# Patient Record
Sex: Female | Born: 1995 | Race: White | Hispanic: No | Marital: Single | Smoking: Current every day smoker
Health system: Southern US, Community
[De-identification: ages and names within clinical notes are randomized; demographics above are authoritative.]

## PROBLEM LIST (undated history)

## (undated) DIAGNOSIS — Z9151 Personal history of suicidal behavior: Secondary | ICD-10-CM

## (undated) DIAGNOSIS — Z8619 Personal history of other infectious and parasitic diseases: Secondary | ICD-10-CM

## (undated) DIAGNOSIS — F419 Anxiety disorder, unspecified: Secondary | ICD-10-CM

## (undated) DIAGNOSIS — F329 Major depressive disorder, single episode, unspecified: Secondary | ICD-10-CM

## (undated) DIAGNOSIS — R45851 Suicidal ideations: Secondary | ICD-10-CM

## (undated) DIAGNOSIS — O039 Complete or unspecified spontaneous abortion without complication: Secondary | ICD-10-CM

## (undated) DIAGNOSIS — F909 Attention-deficit hyperactivity disorder, unspecified type: Secondary | ICD-10-CM

## (undated) DIAGNOSIS — IMO0001 Reserved for inherently not codable concepts without codable children: Secondary | ICD-10-CM

## (undated) DIAGNOSIS — Z915 Personal history of self-harm: Secondary | ICD-10-CM

## (undated) DIAGNOSIS — J45909 Unspecified asthma, uncomplicated: Secondary | ICD-10-CM

## (undated) DIAGNOSIS — F32A Depression, unspecified: Secondary | ICD-10-CM

## (undated) HISTORY — DX: Personal history of suicidal behavior: Z91.51

## (undated) HISTORY — DX: Suicidal ideations: R45.851

## (undated) HISTORY — DX: Depression, unspecified: F32.A

## (undated) HISTORY — DX: Personal history of self-harm: Z91.5

## (undated) HISTORY — PX: INDUCED ABORTION: SHX677

## (undated) HISTORY — DX: Unspecified asthma, uncomplicated: J45.909

## (undated) HISTORY — DX: Major depressive disorder, single episode, unspecified: F32.9

## (undated) HISTORY — DX: Personal history of other infectious and parasitic diseases: Z86.19

---

## 2015-03-26 DIAGNOSIS — F4325 Adjustment disorder with mixed disturbance of emotions and conduct: Secondary | ICD-10-CM | POA: Diagnosis not present

## 2015-03-26 DIAGNOSIS — Z3A00 Weeks of gestation of pregnancy not specified: Secondary | ICD-10-CM | POA: Insufficient documentation

## 2015-03-26 DIAGNOSIS — F17210 Nicotine dependence, cigarettes, uncomplicated: Secondary | ICD-10-CM | POA: Diagnosis not present

## 2015-03-26 DIAGNOSIS — O99330 Smoking (tobacco) complicating pregnancy, unspecified trimester: Secondary | ICD-10-CM | POA: Insufficient documentation

## 2015-03-26 DIAGNOSIS — F329 Major depressive disorder, single episode, unspecified: Secondary | ICD-10-CM | POA: Insufficient documentation

## 2015-03-26 DIAGNOSIS — O99340 Other mental disorders complicating pregnancy, unspecified trimester: Secondary | ICD-10-CM | POA: Diagnosis not present

## 2015-03-26 DIAGNOSIS — F1210 Cannabis abuse, uncomplicated: Secondary | ICD-10-CM | POA: Insufficient documentation

## 2015-03-26 NOTE — ED Notes (Signed)
Pt presents to ED with suicidal thoughts since Thursday. Lost custody of her daughter last week and found out she was pregnant on Thursday. Pt states her "baby daddy" left right after she found out she was pregnant and now she wants to die. Has been crying all day and talking about killing herself.  has hx of using oxycodone, percocet, marijuana, clonopin. Previous suicide attempt by jumping out of a car several years ago.

## 2015-03-27 ENCOUNTER — Emergency Department
Admission: EM | Admit: 2015-03-27 | Discharge: 2015-03-27 | Disposition: A | Discharge status: Home / Self Care | Payer: Medicaid Other | Attending: Emergency Medicine | Admitting: Emergency Medicine

## 2015-03-27 ENCOUNTER — Encounter: Payer: Self-pay | Admitting: Emergency Medicine

## 2015-03-27 DIAGNOSIS — F1210 Cannabis abuse, uncomplicated: Secondary | ICD-10-CM

## 2015-03-27 DIAGNOSIS — F329 Major depressive disorder, single episode, unspecified: Secondary | ICD-10-CM

## 2015-03-27 DIAGNOSIS — F4325 Adjustment disorder with mixed disturbance of emotions and conduct: Secondary | ICD-10-CM

## 2015-03-27 DIAGNOSIS — R45851 Suicidal ideations: Secondary | ICD-10-CM

## 2015-03-27 DIAGNOSIS — F32A Depression, unspecified: Secondary | ICD-10-CM

## 2015-03-27 DIAGNOSIS — Z3490 Encounter for supervision of normal pregnancy, unspecified, unspecified trimester: Secondary | ICD-10-CM

## 2015-03-27 HISTORY — DX: Attention-deficit hyperactivity disorder, unspecified type: F90.9

## 2015-03-27 HISTORY — DX: Anxiety disorder, unspecified: F41.9

## 2015-03-27 LAB — URINALYSIS COMPLETE WITH MICROSCOPIC (ARMC ONLY)
BACTERIA UA: NONE SEEN
BILIRUBIN URINE: NEGATIVE
GLUCOSE, UA: NEGATIVE mg/dL
HGB URINE DIPSTICK: NEGATIVE
Ketones, ur: NEGATIVE mg/dL
Nitrite: NEGATIVE
Protein, ur: NEGATIVE mg/dL
Specific Gravity, Urine: 1.016 (ref 1.005–1.030)
pH: 6 (ref 5.0–8.0)

## 2015-03-27 LAB — CBC WITH DIFFERENTIAL/PLATELET
BASOS PCT: 1 %
Basophils Absolute: 0 10*3/uL (ref 0–0.1)
Eosinophils Absolute: 0.1 10*3/uL (ref 0–0.7)
Eosinophils Relative: 2 %
HEMATOCRIT: 41.9 % (ref 35.0–47.0)
HEMOGLOBIN: 13.9 g/dL (ref 12.0–16.0)
LYMPHS ABS: 2.2 10*3/uL (ref 1.0–3.6)
Lymphocytes Relative: 24 %
MCH: 30.3 pg (ref 26.0–34.0)
MCHC: 33.3 g/dL (ref 32.0–36.0)
MCV: 90.9 fL (ref 80.0–100.0)
MONO ABS: 0.8 10*3/uL (ref 0.2–0.9)
MONOS PCT: 9 %
NEUTROS ABS: 6.1 10*3/uL (ref 1.4–6.5)
Neutrophils Relative %: 64 %
Platelets: 278 10*3/uL (ref 150–440)
RBC: 4.61 MIL/uL (ref 3.80–5.20)
RDW: 12.6 % (ref 11.5–14.5)
WBC: 9.3 10*3/uL (ref 3.6–11.0)

## 2015-03-27 LAB — COMPREHENSIVE METABOLIC PANEL
ALBUMIN: 4.8 g/dL (ref 3.5–5.0)
ALK PHOS: 51 U/L (ref 38–126)
ALT: 14 U/L (ref 14–54)
AST: 18 U/L (ref 15–41)
Anion gap: 7 (ref 5–15)
BILIRUBIN TOTAL: 0.9 mg/dL (ref 0.3–1.2)
BUN: 8 mg/dL (ref 6–20)
CALCIUM: 10 mg/dL (ref 8.9–10.3)
CO2: 26 mmol/L (ref 22–32)
Chloride: 104 mmol/L (ref 101–111)
Creatinine, Ser: 0.66 mg/dL (ref 0.44–1.00)
GFR calc Af Amer: >60 mL/min (ref >60)
GFR calc non Af Amer: >60 mL/min (ref >60)
GLUCOSE: 87 mg/dL (ref 65–99)
POTASSIUM: 3.9 mmol/L (ref 3.5–5.1)
SODIUM: 137 mmol/L (ref 135–145)
TOTAL PROTEIN: 8.8 g/dL — AB (ref 6.5–8.1)

## 2015-03-27 LAB — URINE DRUG SCREEN, QUALITATIVE (ARMC ONLY)
Amphetamines, Ur Screen: NOT DETECTED
BARBITURATES, UR SCREEN: NOT DETECTED
BENZODIAZEPINE, UR SCRN: NOT DETECTED
CANNABINOID 50 NG, UR ~~LOC~~: POSITIVE — AB
COCAINE METABOLITE, UR ~~LOC~~: NOT DETECTED
MDMA (Ecstasy)Ur Screen: NOT DETECTED
METHADONE SCREEN, URINE: NOT DETECTED
OPIATE, UR SCREEN: NOT DETECTED
PHENCYCLIDINE (PCP) UR S: NOT DETECTED
Tricyclic, Ur Screen: NOT DETECTED

## 2015-03-27 LAB — PREGNANCY, URINE: PREG TEST UR: POSITIVE — AB

## 2015-03-27 LAB — ETHANOL: Alcohol, Ethyl (B): <5 mg/dL (ref <5)

## 2015-03-27 NOTE — ED Notes (Signed)
BEHAVIORAL HEALTH ROUNDING Patient sleeping: No. Patient alert and oriented: yes Behavior appropriate: Yes.  ; If no, describe:  Nutrition and fluids offered: {No Toileting and hygiene offered: {No Sitter present: {YES Law enforcement present: {YES

## 2015-03-27 NOTE — ED Notes (Signed)
Patient sleeping currently. Visible by this RN as well as EDT and security. No distress noted.

## 2015-03-27 NOTE — ED Provider Notes (Signed)
-----------------------------------------   7:12 AM on 03/27/2015 -----------------------------------------   Blood pressure 103/70, pulse 85, temperature 98.6 F (37 C), temperature source Oral, resp. rate 20, height  (1.676 m), weight 111 lb (50.349 kg), last menstrual period 01/29/2015, SpO2 100 %.  The patient had no acute events since last update.  Calm and resting at this time.  Disposition is pending per Psychiatry/Behavioral Medicine team recommendations.     Sharyn Creamer, MD 03/27/15 [phone]

## 2015-03-27 NOTE — ED Notes (Signed)

## 2015-03-27 NOTE — ED Provider Notes (Signed)
[facility] Emergency Department Brandi Tucker Note  ____________________________________________  Time seen: Approximately 152 AM  I have reviewed the triage vital signs and the nursing notes.   HISTORY  Chief Complaint Suicidal    HPI Brandi Tucker is a 19 y.o. female who comes into the hospital because she is depressed. The patient reports that she is pregnant again and is unsure how far along she is. The patient reports that she has a 74-month-old daughter and does not want her daughter to be taken away from her. The patient reports that she had an abortion 2 months ago so her pregnancy is now considered high risk. The patient is having thoughts of killing herself. The patient denies having tried to kill herself before and does not have a plan. The patient reports she has taken Klonopin in the past but has not taken any because she is pregnant. The patient has some pain on the side but she reports it is getting better. According to the triage note the patient has tried to kill herself in the past by jumping out of a car and her daughter was taken away from her last week.   Past Medical History  Diagnosis Date  . ADHD (attention deficit hyperactivity disorder)   . Anxiety     There are no active problems to display for this patient.   Past Surgical History  Procedure Laterality Date  . Induced abortion      No current outpatient prescriptions on file.  Allergies Review of patient's allergies indicates no known allergies.  No family history on file.  Social History Social History  Substance Use Topics  . Smoking status: Current Every Day Smoker -- 0.50 packs/day    Types: Cigarettes  . Smokeless tobacco: Not on file  . Alcohol Use: Yes    Review of Systems Constitutional: No fever/chills Eyes: No visual changes. ENT: No sore throat. Cardiovascular: Denies chest pain. Respiratory: Denies shortness of breath. Gastrointestinal: No abdominal  pain.  No nausea, no vomiting.  No diarrhea.  No constipation. Genitourinary: Negative for dysuria. Musculoskeletal: Negative for back pain. Skin: Negative for rash. Neurological: Negative for headaches, focal weakness or numbness. Psych: Depressed  10-point ROS otherwise negative.  ____________________________________________   PHYSICAL EXAM:  VITAL SIGNS: ED Triage Vitals  Enc Vitals Group     BP 03/27/15 0001 115/73 mmHg     Pulse Rate 03/27/15 0001 99     Resp 03/27/15 0001 20     Temp 03/27/15 0001 98.6 F (37 C)     Temp Source 03/27/15 0001 Oral     SpO2 03/27/15 0001 100 %     Weight 03/27/15 0001 111 lb (50.349 kg)     Height 03/27/15 0001  (1.676 m)     Head Cir --      Peak Flow --      Pain Score 03/27/15 0002 0     Pain Loc --      Pain Edu? --      Excl. in GC? --     Constitutional: Alert and oriented. Well appearing and in no acute distress. Eyes: Conjunctivae are normal. PERRL. EOMI. Head: Atraumatic. Nose: No congestion/rhinnorhea. Mouth/Throat: Mucous membranes are moist.  Oropharynx non-erythematous. Cardiovascular: Normal rate, regular rhythm. Grossly normal heart sounds.  Good peripheral circulation. Respiratory: Normal respiratory effort.  No retractions. Lungs CTAB. Gastrointestinal: Soft and nontender. No distention. Positive bowel sounds. Musculoskeletal: No lower extremity tenderness nor edema.   Neurologic:  Normal speech and  language.  Skin:  Skin is warm, dry and intact.  Psychiatric: Depressed mood  ____________________________________________   LABS (all labs ordered are listed, but only abnormal results are displayed)  Labs Reviewed  COMPREHENSIVE METABOLIC PANEL - Abnormal; Notable for the following:    Total Protein 8.8 (*)    All other components within normal limits  URINE DRUG SCREEN, QUALITATIVE ([facility] ONLY) - Abnormal; Notable for the following:    Cannabinoid 50 Ng, Ur [place] POSITIVE (*)    All other components within  normal limits  URINALYSIS COMPLETEWITH MICROSCOPIC ([facility] ONLY) - Abnormal; Notable for the following:    Color, Urine YELLOW (*)    APPearance CLEAR (*)    Leukocytes, UA TRACE (*)    Squamous Epithelial / LPF 0-5 (*)    All other components within normal limits  PREGNANCY, URINE - Abnormal; Notable for the following:    Preg Test, Ur POSITIVE (*)    All other components within normal limits  ETHANOL  CBC WITH DIFFERENTIAL/PLATELET  POC URINE PREG, ED   ____________________________________________  EKG  none ____________________________________________  RADIOLOGY  None ____________________________________________   PROCEDURES  Procedure(s) performed: None  Critical Care performed: No  ____________________________________________   INITIAL IMPRESSION / ASSESSMENT AND PLAN / ED COURSE  Pertinent labs & imaging results that were available during my care of the patient were reviewed by me and considered in my medical decision making (see chart for details).  This is a 19 year old female who comes in with feeling thoughts of killing herself and depression. I will have the patient evaluated by the behavioral team for further evaluation. The patient will be evaluated by psych ____________________________________________   FINAL CLINICAL IMPRESSION(S) / ED DIAGNOSES  Final diagnoses:  Depression  Suicidal ideation      Rebecka Apley, MD 03/27/15 [phone]

## 2015-03-27 NOTE — ED Notes (Signed)
Meal given

## 2015-03-27 NOTE — ED Notes (Signed)
Patient spoke with RN regarding her continued depressed mood. She is still endorsing suicidal thoughts. Very worried about pregnancy. Patient is willing to be hospitalized for treatment if necessary. Maintained on 15 minute checks and observation by security camera for safety.

## 2015-03-27 NOTE — ED Notes (Signed)
Patient resting comfortably in room. No complaints or concerns voiced. No distress or abnormal behavior noted. Will continue to monitor with security cameras. Q 15 minute rounds continue. 
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
## 2015-03-27 NOTE — Discharge Instructions (Signed)
Depression Depression is feeling sad, low, down in the dumps, blue, gloomy, or empty. In general, there are two kinds of depression:  Normal sadness or grief. This can happen after something upsetting. It often goes away on its own within 2 weeks. After losing a loved one (bereavement), normal sadness and grief may last longer than two weeks. It usually gets better with time.  Clinical depression. This kind lasts longer than normal sadness or grief. It keeps you from doing the things you normally do in life. It is often hard to function at home, work, or at school. It may affect your relationships with others. Treatment is often needed. GET HELP RIGHT AWAY IF:  You have thoughts about hurting yourself or others.  You lose touch with reality (psychotic symptoms). You may:  See or hear things that are not real.  Have untrue beliefs about your life or people around you.  Your medicine is giving you problems. MAKE SURE YOU:  Understand these instructions.  Will watch your condition.  Will get help right away if you are not doing well or get worse. Document Released: 07/19/2010 Document Revised: 10/31/2013 Document Reviewed: 10/16/2011 Exit[employer] Patient Information 2015 [employer]. This information is not intended to replace advice given to you by your health care provider. Make sure you discuss any questions you have with your health care provider.  

## 2015-03-27 NOTE — BH Assessment (Signed)
Assessment Note  Brandi Tucker is an 19 y.o. female. [name] reports to the ED due to suicidal ideation.  She reports that she is feeling really depressed.  She stated repeatedly that she was "really depressed".  She reported to the TTS that she is pregnant and "I was in my feelings, I wanted to kill myself, but I'm fine now".  She reports that she has been having thoughts of suicide since last Thursday.  It was reported that she lost custody of her child last week, She had an abortion about 2 months ago, and she found out she was pregnant again 4 days ago.  She reports that her "baby daddy" broke up with her today after he found out she was pregnant.  She reports that she has used Oxycodone, Percocet, and marijuana in the past.  Diagnosis:   Past Medical History:  Past Medical History  Diagnosis Date  . ADHD (attention deficit hyperactivity disorder)   . Anxiety     Past Surgical History  Procedure Laterality Date  . Induced abortion      Family History: No family history on file.  Social History:  reports that she has been smoking Cigarettes.  She has been smoking about 0.50 packs per day. She does not have any smokeless tobacco history on file. She reports that she drinks alcohol. She reports that she uses illicit drugs (Marijuana).  Additional Social History:  Alcohol / Drug Use History of alcohol / drug use?: Yes  CIWA: CIWA-Ar BP: 115/73 mmHg Pulse Rate: 99 COWS:    Allergies: No Known Allergies  Home Medications:  (Not in a hospital admission)  OB/GYN Status:  Patient's last menstrual period was 01/29/2015.  General Assessment Data Location of Assessment: [facility] ED TTS Assessment: In system Is this a Tele or Face-to-Face Assessment?: Face-to-Face Is this an Initial Assessment or a Re-assessment for this encounter?: Initial Assessment Marital status: Single Is patient pregnant?: Yes Pregnancy Status: Unknown Can pt return to current living arrangement?:  Yes Admission Status: Involuntary Is patient capable of signing voluntary admission?: Yes Referral Source: Other Insurance type: Medicaid  Medical Screening Exam [facility] Walk-in ONLY) Medical Exam completed: Yes  Crisis Care Plan Name of Psychiatrist: None Name of Therapist: None  Education Status Is patient currently in school?: No Current Grade: n/a Highest grade of school patient has completed: 10th Name of school: n/a Contact person: n/a  Risk to self with the past 6 months Suicidal Ideation: No-Not Currently/Within Last 6 Months (prior to arrival at ED) Has patient been a risk to self within the past 6 months prior to admission? : No Suicidal Intent: No-Not Currently/Within Last 6 Months Has patient had any suicidal intent within the past 6 months prior to admission? : No Is patient at risk for suicide?: Yes Suicidal Plan?: No Has patient had any suicidal plan within the past 6 months prior to admission? : No Access to Means: No What has been your use of drugs/alcohol within the last 12 months?: Prior use of oxycodone, percocet, and marijuana Previous Attempts/Gestures: Yes How many times?: 1 Other Self Harm Risks: none Triggers for Past Attempts: Unknown Intentional Self Injurious Behavior: None Family Suicide History: No Recent stressful life event(s): Conflict (Comment) (Break up with boyfriend) Persecutory voices/beliefs?: No Depression: Yes Depression Symptoms: Tearfulness, Despondent Substance abuse history and/or treatment for substance abuse?: Yes Suicide prevention information given to non-admitted patients: Not applicable  Risk to Others within the past 6 months Homicidal Ideation: No Does patient have any  lifetime risk of violence toward others beyond the six months prior to admission? : No Thoughts of Harm to Others: No Current Homicidal Intent: No Current Homicidal Plan: No Access to Homicidal Means: No Identified Victim: None History of harm to  others?: No Assessment of Violence: None Noted Violent Behavior Description: None reported Does patient have access to weapons?: No Criminal Charges Pending?: No Does patient have a court date: Yes Court Date: 04/12/16 (Use of electric mail to harass/07-26-2015 Speeding) Is patient on probation?: No  Psychosis Hallucinations: None noted Delusions: None noted  Mental Status Report Appearance/Hygiene: In scrubs Eye Contact: Fair Motor Activity: Unremarkable Speech: Soft Level of Consciousness: Quiet/awake Mood: Empty Affect: Flat Anxiety Level: None Thought Processes: Coherent Judgement: Partial Orientation: Place, Time, Person Obsessive Compulsive Thoughts/Behaviors: Minimal  Cognitive Functioning Concentration: Normal Memory: Recent Intact IQ: Average Insight: Fair Impulse Control: Fair Appetite: Good Sleep: No Change Vegetative Symptoms: None  ADLScreening [facility] Assessment Services) Patient's cognitive ability adequate to safely complete daily activities?: No Patient able to express need for assistance with ADLs?: No Independently performs ADLs?: No  Prior Inpatient Therapy Prior Inpatient Therapy: No  Prior Outpatient Therapy Prior Outpatient Therapy: No Does patient have an ACCT team?: No Does patient have Intensive In-House Services?  : No Does patient have [facility] services? : No Does patient have P4CC services?: No  ADL Screening (condition at time of admission) Patient's cognitive ability adequate to safely complete daily activities?: No Patient able to express need for assistance with ADLs?: No Independently performs ADLs?: No       Abuse/Neglect Assessment (Assessment to be complete while patient is alone) Physical Abuse: Denies Verbal Abuse: Denies Sexual Abuse: Denies Exploitation of patient/patient's resources: Denies Self-Neglect: Denies Values / Beliefs Cultural Requests During Hospitalization: None Spiritual Requests During  Hospitalization: None   Advance Directives (For Healthcare) Does patient have an advance directive?: No    Additional Information 1:1 In Past 12 Months?: No CIRT Risk: No Elopement Risk: No Does patient have medical clearance?: Yes     Disposition:  Disposition Initial Assessment Completed for this Encounter: Yes Disposition of Patient: Referred to (Psychiatrist)  On Site Evaluation by:   Reviewed with Physician:    Justice Deeds 03/27/2015 3:28 AM

## 2015-03-27 NOTE — ED Notes (Signed)
BEHAVIORAL HEALTH ROUNDING Patient sleeping: Yes.   Patient alert and oriented: yes Behavior appropriate: Yes.  ; If no, describe:  Nutrition and fluids offered: No Toileting and hygiene offered: No Sitter present: yes Law enforcement present: Yes  
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
## 2015-03-27 NOTE — ED Notes (Signed)
Patient denies SI or HI. Discharge instructions reviewed with patient. She verbalizes understanding. Patient received copy of DC plan and all personal belongings.

## 2015-03-27 NOTE — ED Notes (Signed)
Pt. To BHU from ED ambulatory without difficulty, to room 8 . Report from [facility] RN. Pt. Is alert and oriented, warm and dry in no distress. Pt. Denies SI, HI, and AVH. Pt. Calm and cooperative. Pt. Made aware of security cameras and Q15 minute rounds. Pt. Encouraged to let Nursing staff know of any concerns or needs.

## 2015-03-27 NOTE — ED Notes (Signed)
ENVIRONMENTAL ASSESSMENT Potentially harmful objects out of patient reach: Yes.   Personal belongings secured: Yes.   Patient dressed in hospital provided attire only: Yes.   Plastic bags out of patient reach: Yes.   Patient care equipment (cords, cables, call bells, lines, and drains) shortened, removed, or accounted for: Yes.   Equipment and supplies removed from bottom of stretcher: Yes.   Potentially toxic materials out of patient reach: Yes.   Sharps container removed or out of patient reach: Yes.     Patient asleep on initial rounds. Maintained on 15 minute checks and observation by security camera for safety. 

## 2015-03-27 NOTE — ED Notes (Signed)
Patient to move to ED BHU per Dr. Zenda Alpers.

## 2015-03-27 NOTE — ED Notes (Signed)
Patient with visitor, supervised by patient advocate.  Maintained on 15 minute checks and observation by security camera for safety.

## 2015-03-29 ENCOUNTER — Emergency Department
Admission: EM | Admit: 2015-03-29 | Discharge: 2015-03-29 | Discharge status: Against Medical Advice | Payer: Medicaid Other | Attending: Emergency Medicine | Admitting: Emergency Medicine

## 2015-03-29 ENCOUNTER — Encounter: Payer: Self-pay | Admitting: Emergency Medicine

## 2015-03-29 ENCOUNTER — Emergency Department: Payer: Medicaid Other

## 2015-03-29 DIAGNOSIS — O99331 Smoking (tobacco) complicating pregnancy, first trimester: Secondary | ICD-10-CM | POA: Diagnosis not present

## 2015-03-29 DIAGNOSIS — R102 Pelvic and perineal pain: Secondary | ICD-10-CM | POA: Insufficient documentation

## 2015-03-29 DIAGNOSIS — O9989 Other specified diseases and conditions complicating pregnancy, childbirth and the puerperium: Secondary | ICD-10-CM | POA: Insufficient documentation

## 2015-03-29 DIAGNOSIS — Z3A01 Less than 8 weeks gestation of pregnancy: Secondary | ICD-10-CM | POA: Insufficient documentation

## 2015-03-29 DIAGNOSIS — F17210 Nicotine dependence, cigarettes, uncomplicated: Secondary | ICD-10-CM | POA: Insufficient documentation

## 2015-03-29 HISTORY — DX: Complete or unspecified spontaneous abortion without complication: O03.9

## 2015-03-29 HISTORY — DX: Reserved for inherently not codable concepts without codable children: IMO0001

## 2015-03-29 LAB — CBC
HCT: 39.4 % (ref 35.0–47.0)
Hemoglobin: 13.2 g/dL (ref 12.0–16.0)
MCH: 30.4 pg (ref 26.0–34.0)
MCHC: 33.7 g/dL (ref 32.0–36.0)
MCV: 90.4 fL (ref 80.0–100.0)
Platelets: 223 10*3/uL (ref 150–440)
RBC: 4.35 MIL/uL (ref 3.80–5.20)
RDW: 12.9 % (ref 11.5–14.5)
WBC: 10.8 10*3/uL (ref 3.6–11.0)

## 2015-03-29 LAB — ABO/RH: ABO/RH(D): O POS

## 2015-03-29 LAB — POCT PREGNANCY, URINE: Preg Test, Ur: POSITIVE — AB

## 2015-03-29 LAB — HCG, QUANTITATIVE, PREGNANCY: hCG, Beta Chain, Quant, S: 22648 m[IU]/mL — ABNORMAL HIGH (ref <5)

## 2015-03-29 NOTE — ED Provider Notes (Signed)
[facility] Emergency Department Provider Note  ____________________________________________  Time seen: 1:40 PM  I have reviewed the triage vital signs and the nursing notes.   HISTORY  Chief Complaint Pelvic Pain    HPI Brandi Tucker is a 19 y.o. female who complains of left lower pelvic pain which she reports it is moderate in intensity and sharp in nature. She thinks she is partially [redacted] weeks pregnant. She recently had a abortion 3-4 months ago. She denies fevers chills. No dysuria. No flank pain. No nausea no vomiting. No vaginal bleeding. No vaginal discharge. Her last period was the beginning of August     Past Medical History  Diagnosis Date  . ADHD (attention deficit hyperactivity disorder)   . Anxiety   . Abortion     Patient Active Problem List   Diagnosis Date Noted  . 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 Pregnancy 03/27/2015  . Marijuana abuse 03/27/2015    Past Surgical History  Procedure Laterality Date  . Induced abortion      No current outpatient prescriptions on file.  Allergies Review of patient's allergies indicates no known allergies.  No family history on file.  Social History Social History  Substance Use Topics  . Smoking status: Current Every Day Smoker -- 0.50 packs/day    Types: Cigarettes  . Smokeless tobacco: None  . Alcohol Use: Yes    Review of Systems  Constitutional: Negative for fever. Eyes: Negative for visual changes. ENT: Negative for sore throat Cardiovascular: Negative for chest pain. Respiratory: Negative for shortness of breath. Gastrointestinal: Negative for abdominal pain, vomiting and diarrhea. Genitourinary: Negative for dysuria. Musculoskeletal: Negative for back pain. Skin: Negative for rash. Neurologic: No headaches    ____________________________________________   PHYSICAL EXAM:  VITAL SIGNS: ED Triage Vitals  Enc Vitals  Group     BP 03/29/15 1203 118/73 mmHg     Pulse Rate 03/29/15 1203 92     Resp 03/29/15 1203 20     Temp 03/29/15 1203 98.3 F (36.8 C)     Temp Source 03/29/15 1203 Oral     SpO2 03/29/15 1203 100 %     Weight 03/29/15 1203 111 lb (50.349 kg)     Height 03/29/15 1203  (1.676 m)     Head Cir --      Peak Flow --      Pain Score 03/29/15 1204 10     Pain Loc --      Pain Edu? --      Excl. in GC? --      Constitutional: Alert and oriented. Well appearing and in no distress. Eyes: Conjunctivae are normal.  ENT   Head: Normocephalic and atraumatic.   Mouth/Throat: Mucous membranes are moist. Cardiovascular: Normal rate, regular rhythm. Normal and symmetric distal pulses are present in all extremities. No murmurs, rubs, or gallops. Respiratory: Normal respiratory effort without tachypnea nor retractions. Breath sounds are clear and equal bilaterally.  Gastrointestinal: With mild tenderness to palpation in the left lower quadrant. No distention. There is no CVA tenderness. Genitourinary: deferred Musculoskeletal: Nontender with normal range of motion in all extremities. No lower extremity tenderness nor edema. Neurologic:  Normal speech and language. No gross focal neurologic deficits are appreciated. Skin:  Skin is warm, dry and intact. No rash noted. Psychiatric: Mood and affect are normal. Patient exhibits appropriate insight and judgment.  ____________________________________________    LABS (pertinent positives/negatives)  Labs Reviewed  HCG, QUANTITATIVE,  PREGNANCY - Abnormal; Notable for the following:    hCG, Beta Chain, [place] [id number] (*)    All other components within normal limits  CBC  ABO/RH    ____________________________________________   EKG  None  ____________________________________________    RADIOLOGY I have personally reviewed any xrays that were ordered on this patient: Ultrasound pelvis  pending  ____________________________________________   PROCEDURES  Procedure(s) performed: none  Critical Care performed: none  ____________________________________________   INITIAL IMPRESSION / ASSESSMENT AND PLAN / ED COURSE  Pertinent labs & imaging results that were available during my care of the patient were reviewed by me and considered in my medical decision making (see chart for details).  Patient overall well-appearing. Her left lower quadrant/pelvic pain and positive pregnancy indicates ultrasound to rule out ectopic pregnancy.  ----------------------------------------- 3:26 PM on 03/29/2015 -----------------------------------------  Patient signed out to Dr. Derrill Kay to follow-up ultrasound  ____________________________________________   FINAL CLINICAL IMPRESSION(S) / ED DIAGNOSES  Final diagnoses:  None     Jene Every, MD 03/29/15 1526

## 2015-03-29 NOTE — ED Notes (Signed)
Pt presents to ed with left side pelvic pain and pressure started last night. Pt is [redacted] weeks pregnant and was sent over for further eval. Pt did have an abortion about two-three mths ago.  No bleeding today.

## 2015-03-29 NOTE — ED Notes (Signed)
C/o sore throat.  Throat pink.  Mucous membranes moist.  NAD.

## 2015-03-29 NOTE — ED Notes (Signed)
Waiting for [place] results.  Results not yet available.  Patient states that she must leave ED now because this is her "only ride and they have to be at work soon".  Discussed risks of signing out AMA, patient verbalizes understanding of risks.  Dr. Derrill Kay alerted.  Patient signed out AMA.  Will call ED later for [place] results.

## 2015-03-29 NOTE — ED Notes (Signed)
AAOx3.  Skin warm and dry.  Moving all extremities equally and strong.  Gait steady.  Posture relaxed.  Patient depart ED.

## 2015-03-29 NOTE — ED Notes (Signed)
POC urine preg:  Positive

## 2015-04-02 ENCOUNTER — Ambulatory Visit (INDEPENDENT_AMBULATORY_CARE_PROVIDER_SITE_OTHER): Payer: Medicaid Other | Admitting: Obstetrics and Gynecology

## 2015-04-02 ENCOUNTER — Other Ambulatory Visit: Payer: Self-pay | Admitting: Obstetrics and Gynecology

## 2015-04-02 VITALS — BP 112/74 | HR 90 | Wt 113.2 lb

## 2015-04-02 DIAGNOSIS — Z1389 Encounter for screening for other disorder: Secondary | ICD-10-CM

## 2015-04-02 DIAGNOSIS — Z331 Pregnant state, incidental: Secondary | ICD-10-CM

## 2015-04-02 DIAGNOSIS — Z36 Encounter for antenatal screening of mother: Secondary | ICD-10-CM

## 2015-04-02 DIAGNOSIS — Z113 Encounter for screening for infections with a predominantly sexual mode of transmission: Secondary | ICD-10-CM

## 2015-04-02 DIAGNOSIS — Z3687 Encounter for antenatal screening for uncertain dates: Secondary | ICD-10-CM
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
## 2015-04-02 NOTE — Patient Instructions (Signed)

## 2015-04-03 ENCOUNTER — Other Ambulatory Visit: Payer: Self-pay

## 2015-04-03 ENCOUNTER — Other Ambulatory Visit: Payer: Self-pay | Admitting: Obstetrics and Gynecology

## 2015-04-03 DIAGNOSIS — Z331 Pregnant state, incidental: Secondary | ICD-10-CM

## 2015-04-03 DIAGNOSIS — Z3687 Encounter for antenatal screening for uncertain dates: Secondary | ICD-10-CM

## 2015-04-03 DIAGNOSIS — Z1389 Encounter for screening for other disorder: Secondary | ICD-10-CM

## 2015-04-03 DIAGNOSIS — Z113 Encounter for screening for infections with a predominantly sexual mode of transmission: Secondary | ICD-10-CM

## 2015-04-03 DIAGNOSIS — O9989 Other specified diseases and conditions complicating pregnancy, childbirth and the puerperium: Principal | ICD-10-CM

## 2015-04-03 DIAGNOSIS — Z2839 Other underimmunization status: Secondary | ICD-10-CM

## 2015-04-03 DIAGNOSIS — Z283 Underimmunization status: Secondary | ICD-10-CM

## 2015-04-04 LAB — GC/CHLAMYDIA PROBE AMP
Chlamydia trachomatis, NAA: NEGATIVE
Neisseria gonorrhoeae by PCR: NEGATIVE

## 2015-04-05 ENCOUNTER — Ambulatory Visit: Payer: Medicaid Other

## 2015-04-05 ENCOUNTER — Other Ambulatory Visit: Payer: Medicaid Other

## 2015-04-05 DIAGNOSIS — Z36 Encounter for antenatal screening of mother: Secondary | ICD-10-CM | POA: Diagnosis not present

## 2015-04-05 DIAGNOSIS — Z331 Pregnant state, incidental: Secondary | ICD-10-CM

## 2015-04-05 DIAGNOSIS — Z3687 Encounter for antenatal screening for uncertain dates: Secondary | ICD-10-CM

## 2015-04-06 ENCOUNTER — Telehealth: Payer: Self-pay

## 2015-04-06 DIAGNOSIS — Z283 Underimmunization status: Secondary | ICD-10-CM | POA: Insufficient documentation

## 2015-04-06 DIAGNOSIS — O9989 Other specified diseases and conditions complicating pregnancy, childbirth and the puerperium: Principal | ICD-10-CM

## 2015-04-06 DIAGNOSIS — O09899 Supervision of other high risk pregnancies, unspecified trimester: Secondary | ICD-10-CM | POA: Insufficient documentation

## 2015-04-06 LAB — CBC WITH DIFFERENTIAL/PLATELET
BASOS: 0 %
Basophils Absolute: 0 10*3/uL (ref 0.0–0.2)
EOS (ABSOLUTE): 0.8 10*3/uL — ABNORMAL HIGH (ref 0.0–0.4)
EOS: 7 %
HEMATOCRIT: 39.7 % (ref 34.0–46.6)
HEMOGLOBIN: 13.3 g/dL (ref 11.1–15.9)
IMMATURE GRANS (ABS): 0 10*3/uL (ref 0.0–0.1)
IMMATURE GRANULOCYTES: 0 %
LYMPHS: 24 %
Lymphocytes Absolute: 2.8 10*3/uL (ref 0.7–3.1)
MCH: 29.8 pg (ref 26.6–33.0)
MCHC: 33.5 g/dL (ref 31.5–35.7)
MCV: 89 fL (ref 79–97)
MONOCYTES: 8 %
MONOS ABS: 0.9 10*3/uL (ref 0.1–0.9)
NEUTROS PCT: 61 %
Neutrophils Absolute: 6.9 10*3/uL (ref 1.4–7.0)
Platelets: 326 10*3/uL (ref 150–379)
RBC: 4.46 x10E6/uL (ref 3.77–5.28)
RDW: 12.5 % (ref 12.3–15.4)
WBC: 11.5 10*3/uL — AB (ref 3.4–10.8)

## 2015-04-06 LAB — URINE CULTURE

## 2015-04-06 LAB — VARICELLA ZOSTER ANTIBODY, IGM: Varicella IgM: 1.23 index — ABNORMAL HIGH (ref 0.00–0.90)

## 2015-04-06 LAB — RUBELLA ANTIBODY, IGM

## 2015-04-06 LAB — RPR: RPR: NONREACTIVE

## 2015-04-06 LAB — HIV ANTIBODY (ROUTINE TESTING W REFLEX): HIV SCREEN 4TH GENERATION: NONREACTIVE

## 2015-04-06 LAB — BETA HCG QUANT (REF LAB): HCG QUANT: 50854 m[IU]/mL

## 2015-04-06 LAB — HEPATITIS B SURFACE ANTIGEN: Hepatitis B Surface Ag: NEGATIVE

## 2015-04-06 MED ORDER — NITROFURANTOIN MONOHYD MACRO 100 MG PO CAPS: 100.0000 mg | ORAL_CAPSULE | Freq: Two times a day (BID) | ORAL | Status: DC

## 2015-04-06 NOTE — Telephone Encounter (Signed)
Pt aware. Med erx. 

## 2015-04-06 NOTE — Telephone Encounter (Signed)
-----   Message from Hildred Laser, MD sent at 04/06/2015  2:16 PM EDT ----- Regarding: UTI meds Please see note below.   ----- Message -----    From: Hildred Laser, MD    Sent: 04/06/2015   1:54 PM      To: Fenton Malling, LPN  Please inform patient of UTI.  Needs treatment with Macrobid 100 mg BID x 7 days.

## 2015-04-12 ENCOUNTER — Emergency Department
Admission: EM | Admit: 2015-04-12 | Discharge: 2015-04-12 | Discharge status: Against Medical Advice | Payer: Medicaid Other | Attending: Emergency Medicine | Admitting: Emergency Medicine

## 2015-04-12 ENCOUNTER — Encounter: Payer: Self-pay | Admitting: Emergency Medicine

## 2015-04-12 DIAGNOSIS — F17210 Nicotine dependence, cigarettes, uncomplicated: Secondary | ICD-10-CM | POA: Insufficient documentation

## 2015-04-12 DIAGNOSIS — O99331 Smoking (tobacco) complicating pregnancy, first trimester: Secondary | ICD-10-CM | POA: Diagnosis not present

## 2015-04-12 DIAGNOSIS — R1030 Lower abdominal pain, unspecified: Secondary | ICD-10-CM | POA: Diagnosis not present

## 2015-04-12 DIAGNOSIS — O9989 Other specified diseases and conditions complicating pregnancy, childbirth and the puerperium: Secondary | ICD-10-CM | POA: Diagnosis present

## 2015-04-12 DIAGNOSIS — Z3A01 Less than 8 weeks gestation of pregnancy: Secondary | ICD-10-CM | POA: Insufficient documentation

## 2015-04-12 LAB — URINALYSIS COMPLETE WITH MICROSCOPIC (ARMC ONLY)
BACTERIA UA: NONE SEEN
Bilirubin Urine: NEGATIVE
GLUCOSE, UA: NEGATIVE mg/dL
Hgb urine dipstick: NEGATIVE
Ketones, ur: NEGATIVE mg/dL
NITRITE: NEGATIVE
Protein, ur: NEGATIVE mg/dL
SPECIFIC GRAVITY, URINE: 1.018 (ref 1.005–1.030)
pH: 6 (ref 5.0–8.0)

## 2015-04-12 LAB — HCG, QUANTITATIVE, PREGNANCY: HCG, BETA CHAIN, QUANT, S: 113448 m[IU]/mL — AB (ref <5)

## 2015-04-12 LAB — CBC
HEMATOCRIT: 34.8 % — AB (ref 35.0–47.0)
Hemoglobin: 11.5 g/dL — ABNORMAL LOW (ref 12.0–16.0)
MCH: 29.9 pg (ref 26.0–34.0)
MCHC: 33.1 g/dL (ref 32.0–36.0)
MCV: 90.3 fL (ref 80.0–100.0)
Platelets: 250 10*3/uL (ref 150–440)
RBC: 3.85 MIL/uL (ref 3.80–5.20)
RDW: 13 % (ref 11.5–14.5)
WBC: 12.2 10*3/uL — ABNORMAL HIGH (ref 3.6–11.0)

## 2015-04-12 LAB — POCT PREGNANCY, URINE: PREG TEST UR: POSITIVE — AB

## 2015-04-12 NOTE — ED Notes (Addendum)
Pt to triage via w/c with no distress noted; pt reports approx [redacted]wks pregnant, having mid lower abd cramping and vag spotting; G3 P1; rx macrobid 10/7 by [facility] for UTI but did not get medication filled

## 2015-04-12 NOTE — ED Notes (Signed)
Spoke with Dr Dolores FrameSung regarding pt's presenting c/o; pt had u/s 10/6; will await HCG results before ordering repeat u/s at this time

## 2015-05-01 ENCOUNTER — Encounter: Payer: Self-pay | Admitting: Obstetrics and Gynecology

## 2015-05-01 ENCOUNTER — Encounter: Payer: Medicaid Other | Admitting: Obstetrics and Gynecology

## 2015-05-01 ENCOUNTER — Other Ambulatory Visit: Payer: Self-pay | Admitting: Obstetrics and Gynecology

## 2015-05-01 ENCOUNTER — Ambulatory Visit: Payer: Medicaid Other

## 2015-05-01 ENCOUNTER — Telehealth: Payer: Self-pay | Admitting: Obstetrics and Gynecology

## 2015-05-01 ENCOUNTER — Ambulatory Visit (INDEPENDENT_AMBULATORY_CARE_PROVIDER_SITE_OTHER): Payer: Medicaid Other | Admitting: Obstetrics and Gynecology

## 2015-05-01 VITALS — BP 110/69 | HR 86 | Ht 66.0 in | Wt 113.9 lb

## 2015-05-01 DIAGNOSIS — Z30018 Encounter for initial prescription of other contraceptives: Secondary | ICD-10-CM

## 2015-05-01 DIAGNOSIS — Z331 Pregnant state, incidental: Secondary | ICD-10-CM

## 2015-05-01 DIAGNOSIS — Z3687 Encounter for antenatal screening for uncertain dates: Secondary | ICD-10-CM

## 2015-05-01 DIAGNOSIS — Z7251 High risk heterosexual behavior: Secondary | ICD-10-CM | POA: Diagnosis not present

## 2015-05-01 DIAGNOSIS — O039 Complete or unspecified spontaneous abortion without complication: Secondary | ICD-10-CM | POA: Diagnosis not present

## 2015-05-01 DIAGNOSIS — Z716 Tobacco abuse counseling: Secondary | ICD-10-CM

## 2015-05-01 DIAGNOSIS — Z36 Encounter for antenatal screening of mother: Secondary | ICD-10-CM | POA: Diagnosis not present

## 2015-05-01 MED ORDER — NORELGESTROMIN-ETH ESTRADIOL 150-35 MCG/24HR TD PTWK: 1.0000 | MEDICATED_PATCH | TRANSDERMAL | Status: DC
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
## 2015-05-02 ENCOUNTER — Encounter: Payer: Self-pay | Admitting: Obstetrics and Gynecology

## 2015-05-02 NOTE — Progress Notes (Addendum)
GYNECOLOGY PROGRESS NOTE  Subjective:    Patient ID: Brandi Tucker, female    DOB: 07/12/1995, 19 y.o.   MRN: [id number]  HPI  Patient is a 19 y.o. [id number] female who presents for f/u of miscarriage. Patient's last menstrual period was 01/29/2015. Patient reports that she was seen ~ 3 weeks ago at an abortion clinic with plans for termination.  Was supposed to be ~ 13 weeks, however fetus was only noted to be 8 weeks with no FHT.  States that she was given a medication (Cytotec) for missed Ab.  Reports that several days later she noted to have heavy vaginal bleeding and severe cramping.  Was seen several times in the ER [facility]  Notes that most recently she was seen several days ago and was told that there was still some tissue present and that she may need a D&C. Was told to f/u with her GYN.    Patient reports that yesterday she passed something that looks like tissue.  Since then, her bleeding has decreased to minimal, and her cramping has improved.   The following portions of the patient's history were reviewed and updated as appropriate: allergies, current medications, past family history, past medical history, past social history, past surgical history and problem list.  Review of Systems Pertinent items noted in HPI and remainder of comprehensive ROS otherwise negative.   Objective:   Blood pressure 110/69, pulse 86, height 5\' 6"  (1.676 m), weight 113 lb 14.4 oz (51.665 kg), last menstrual period 01/29/2015, unknown if currently breastfeeding. General appearance: alert and no distress Abdomen: soft, non-tender; bowel sounds normal; no masses,  no organomegaly Pelvic: cervix normal in appearance, external genitalia normal, no adnexal masses or tenderness, no cervical motion tenderness, rectovaginal septum normal, uterus normal size, shape, and consistency and vagina normal without discharge and scant blood in vaginal vault.  Cervix closed.  Bladder nontender, urethra without  masses.  Extremities: extremities normal, atraumatic, no cyanosis or edema Neurologic: Alert and oriented X 3, normal strength and tone. Normal symmetric reflexes. Normal coordination and gait   Labs:  O POS/NEG  Imaging:   05/01/2015 Pelvic Ultrasound:  Findings:  No IUP seen. Clinically established (LMP) EDD of 11/05/15.  FHR: NA CRL measurement: None seen Yolk sac and and early anatomy is not seen  Right Ovary measures 3.2 x 1.6 x 2.1 cm. It is normal in appearance. Left Ovary measures 3.9 x 2.5 x 2.3 cm. It is normal appearance. Survey of the adnexa demonstrates no adnexal masses. There is a small amount of peritoneal fluid in the cul de sac.  Impression: 1. No IUP Seen.  Assessment:   Complete Ab Desires contraception  Plan:   1. Complete Abortion - imaging noting completion of miscarriage, no retained products.  Will follow BHCG trend.Marland Kitchen. BHCG ordered today.  To f/u in 2 weeks for repeat labs.  2. Discussion had with patient regarding miscarriage, and desire for contraception. Reviewed all reversible forms of birth control options available including abstinence; over the counter/barrier methods; hormonal contraceptive medication including pill, patch, ring, injection,contraceptive implant; hormonal and nonhormonal IUDs. Risks and benefits reviewed.  Questions were answered.  Patient desires the patch.  Information was given to patient to review. Discussed slightly increased risk of blood clots/DVTs on patch.  Signed consent form.  Will prescribe.  To wait 2 weeks before initiation of patch, to use backup method until after 1st week of use of the contraception.  Counseled on smoking cessation.  3. Discussion had with patient  regarding high risk sexual behavior (was unable to determine FOB due to 3 potential partners).  Counseled on use of barrier method, limiting number of partners.    A total of 15 minutes were spent face-to-face with the patient during this encounter and over  half of that time dealt with counseling and coordination of care.   Hildred Laser, MD [facility]

## 2015-05-02 NOTE — Telephone Encounter (Signed)
Pt was seen in clinic on 05/01/2015 by Dr.cherry and evaluated.

## 2015-05-03 LAB — BETA HCG QUANT (REF LAB): hCG Quant: 520 m[IU]/mL

## 2015-05-03 LAB — SPECIMEN STATUS REPORT

## 2015-05-07 ENCOUNTER — Telehealth: Payer: Self-pay | Admitting: Obstetrics and Gynecology

## 2015-05-07 NOTE — Telephone Encounter (Signed)
PT CALLED FOR RESULTS OF BETA HGB. SHE ALSO SAID SHE HAS TAKEN 3 PREGNANCY TEST AND ALL ARE POSITIVE

## 2015-05-10 NOTE — Telephone Encounter (Signed)
Pt notified of bhcg results: 520 on 05/01/15; as a month ago bhcg result: [id number].  Pt informed that she had miscarried and need another bhcg done in 2 wks. From 05/01/15 and it is very important to f/u with this until results are negative. Pt states she may be pregnant again and that she is seeing her PCP tomorrow and will have him follow her for this. She probably will not be back here for f/u. Dr. Valentino Saxonherry notified. Did not express any concerns about her visits here.

## 2015-05-11 ENCOUNTER — Telehealth: Payer: Self-pay

## 2015-05-11 NOTE — Telephone Encounter (Signed)
[facility] [place]. Pt there stating she is pregnant and test was positive. Pt did not inform them of miscarriage and Bhcg's, currently on 05/01/15 - 520. Dr. Valentino Saxonherry suggested Bhcg if dropping pt is not pregnant and if rises she could be. Office was able to pull records on care everywhere.

## 2016-10-01 IMAGING — US US OB COMP LESS 14 WK
3 of 4 series · 13 of 28 positions shown · non-contrast
Comparison: None.

CLINICAL DATA: Left pelvic pain. Unsure of LMP. Recent spontaneous
abortion approximately 3 months ago.

EXAM:
OBSTETRIC <14 WK US AND TRANSVAGINAL OB US
TECHNIQUE: Both transabdominal and transvaginal ultrasound examinations were
performed for complete evaluation of the gestation as well as the
maternal uterus, adnexal regions, and pelvic cul-de-sac.
Transvaginal technique was performed to assess early pregnancy.

[Series 1: us ob comp less 14 wk · 0.21mm/px · 7 of 112 slices shown (1 of 3)]
[im 8/112]
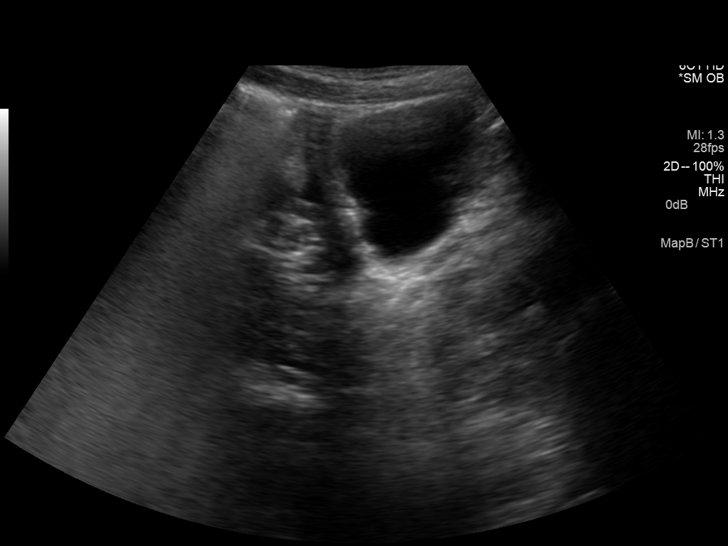
[im 23/112]
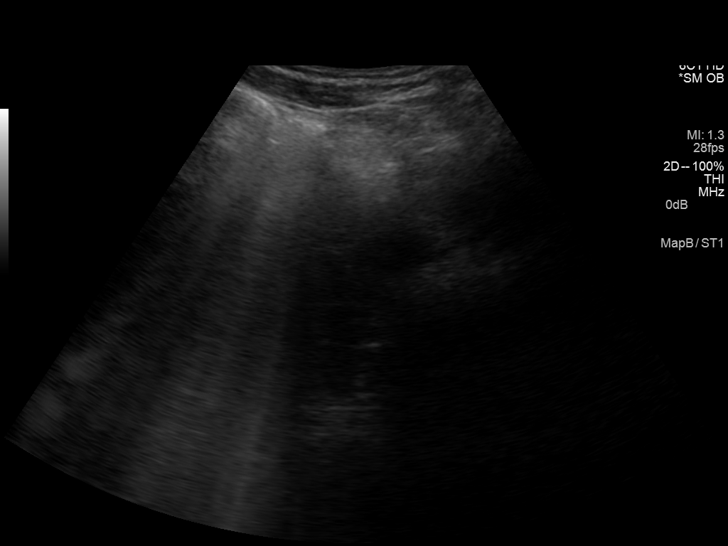
[im 38/112]
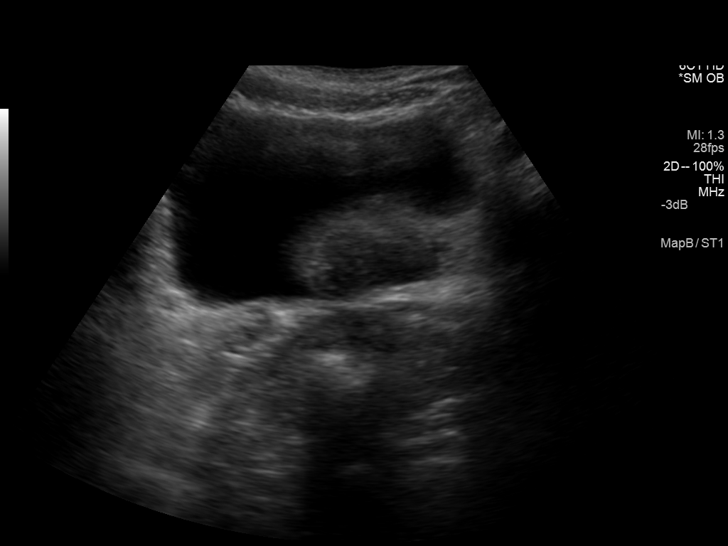
[im 52/112]
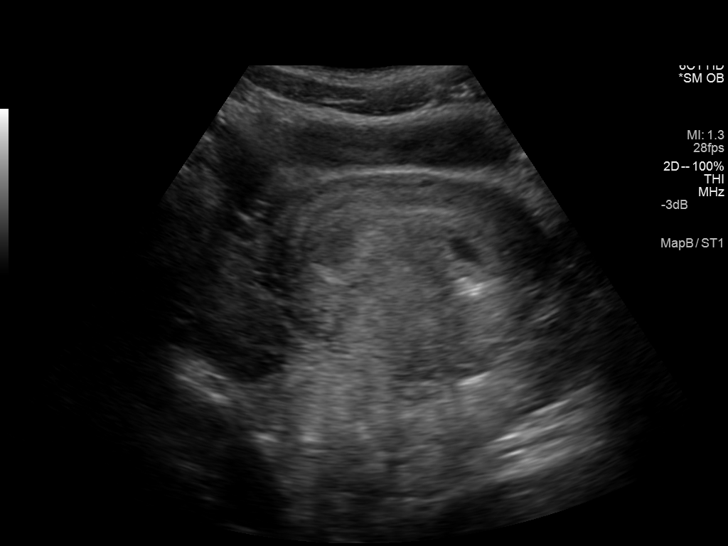
[im 67/112]
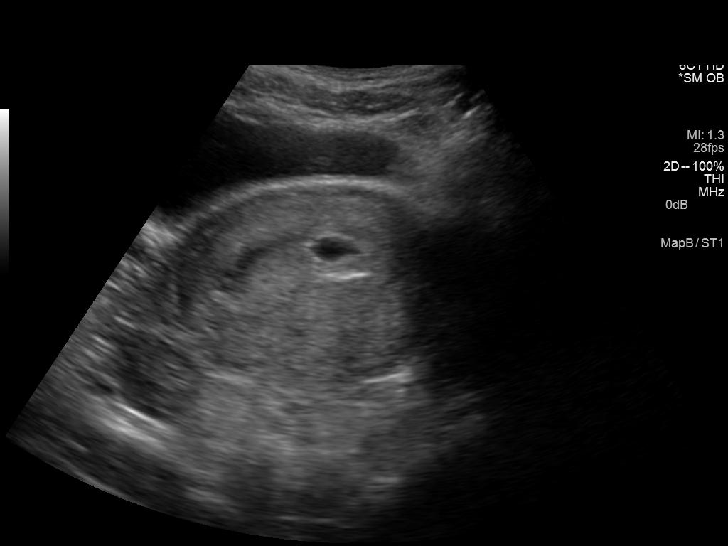
[im 82/112]
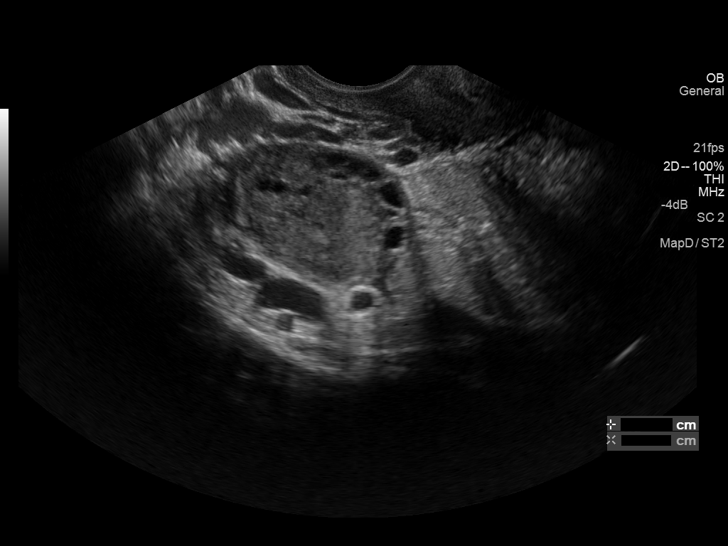
[im 104/112]
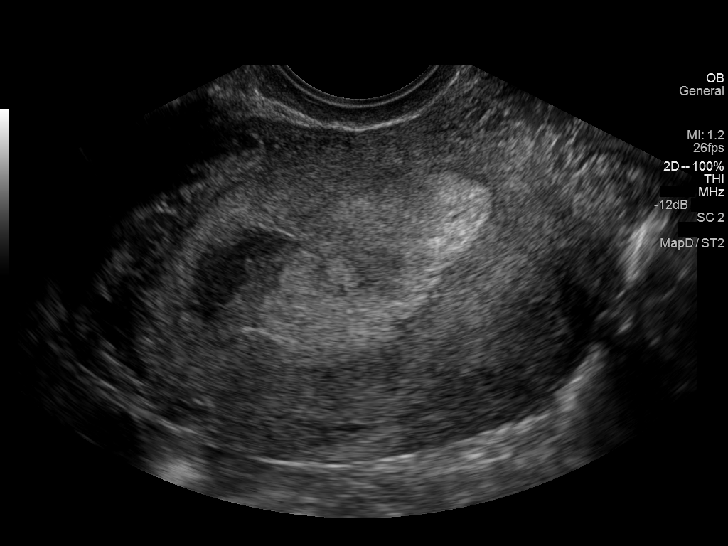

[Series 3: us ob comp less 14 wk · 0.08mm/px · 1 of 2 slices shown (2 of 3)]
[im 1/2]
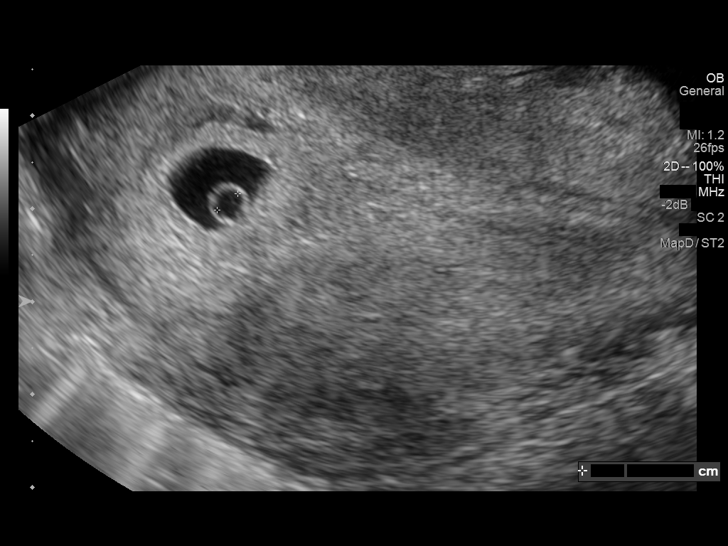

[Series 5: us ob comp less 14 wk · 5 of 67 slices shown (3 of 3)]
[im 1/67]
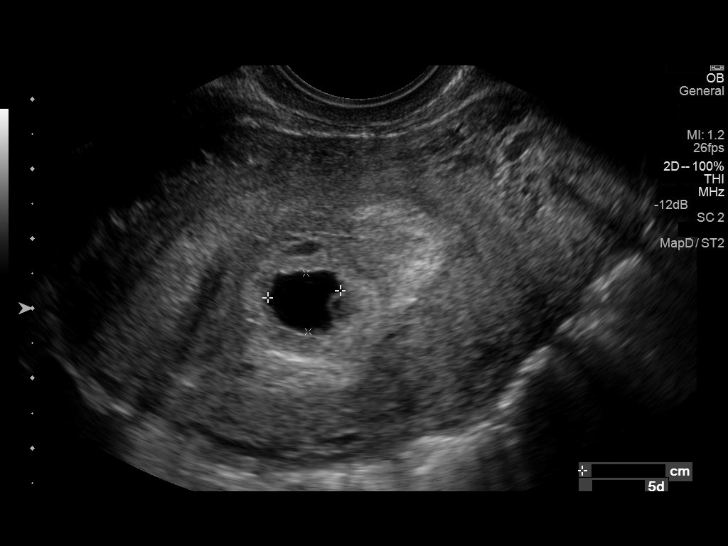
[im 15/67]
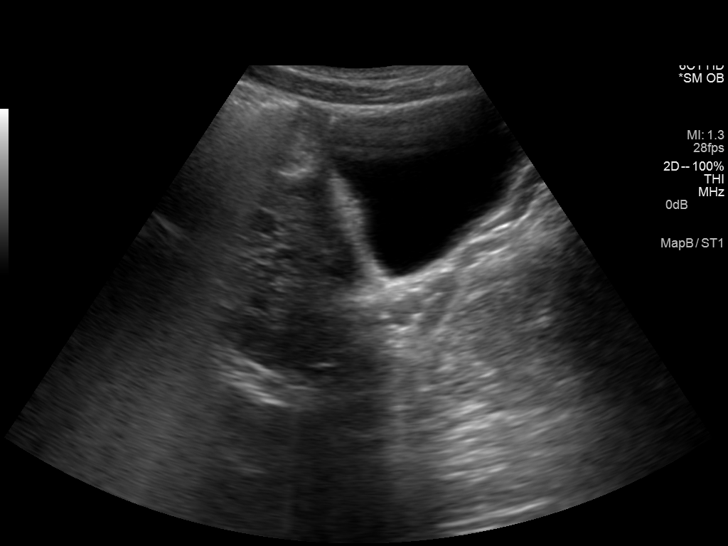
[im 30/67]
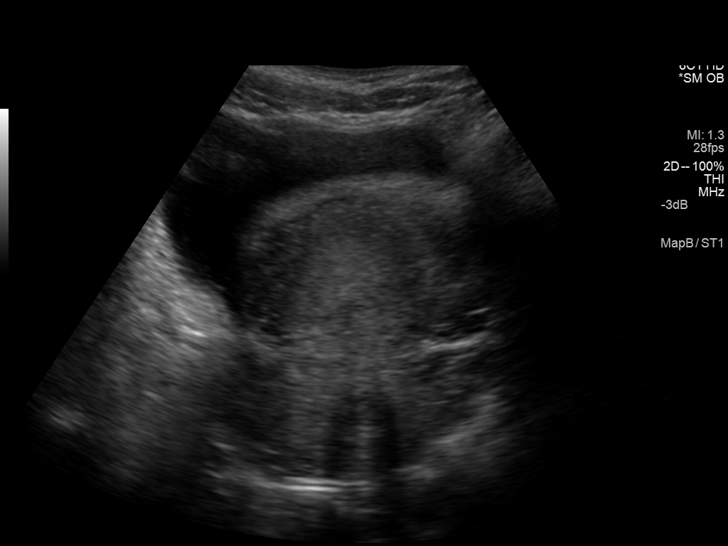
[im 45/67]
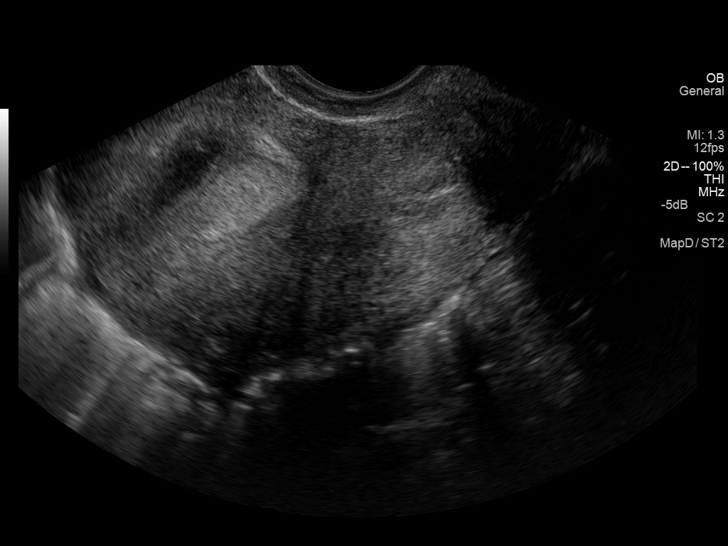
[im 59/67]
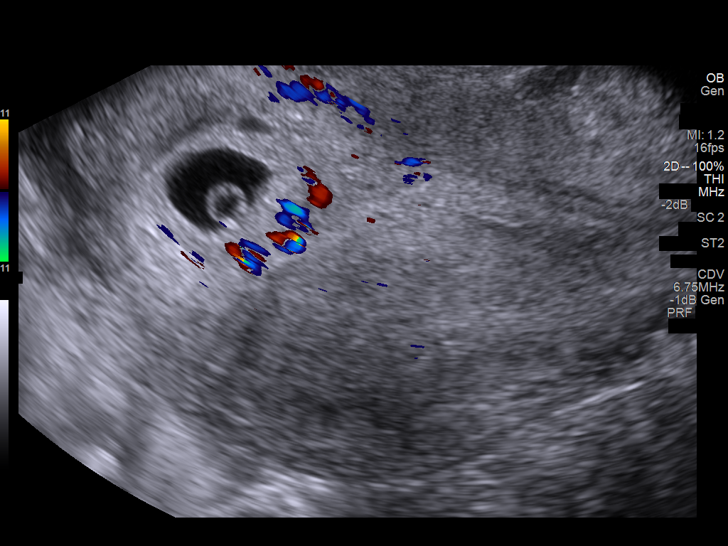

[13 of 28 positions shown; findings below may reference images not displayed]

FINDINGS: Intrauterine gestational sac: Visualized/normal in shape.

Yolk sac:  Visualized

Embryo:  Not visualized

MSD: 10  mm   5 w   5  d

Maternal uterus/adnexae: A moderate to large subchorionic hemorrhage
is seen to the right side of the gestational sac. Both ovaries are
normal in appearance. No mass or free fluid identified.
IMPRESSION: Single intrauterine gestational sac with estimated gestational age
of 5 weeks 5 days by mean sac diameter. Moderate to large
subchorionic hemorrhage is a poor prognostic sign. Recommend
followup of quantitative HCG levels, and consider followup
ultrasound to assess viability in 10 days.

## 2017-10-13 ENCOUNTER — Other Ambulatory Visit: Payer: Self-pay

## 2017-10-13 ENCOUNTER — Encounter (HOSPITAL_COMMUNITY): Payer: Self-pay

## 2017-10-13 ENCOUNTER — Emergency Department (HOSPITAL_COMMUNITY): Payer: Self-pay

## 2017-10-13 ENCOUNTER — Emergency Department (HOSPITAL_COMMUNITY)
Admission: EM | Admit: 2017-10-13 | Discharge: 2017-10-13 | Disposition: A | Discharge status: Home / Self Care | Payer: Self-pay | Attending: Emergency Medicine | Admitting: Emergency Medicine

## 2017-10-13 DIAGNOSIS — Y939 Activity, unspecified: Secondary | ICD-10-CM | POA: Insufficient documentation

## 2017-10-13 DIAGNOSIS — F17210 Nicotine dependence, cigarettes, uncomplicated: Secondary | ICD-10-CM | POA: Insufficient documentation

## 2017-10-13 DIAGNOSIS — Y999 Unspecified external cause status: Secondary | ICD-10-CM | POA: Insufficient documentation

## 2017-10-13 DIAGNOSIS — S6992XA Unspecified injury of left wrist, hand and finger(s), initial encounter: Secondary | ICD-10-CM | POA: Insufficient documentation

## 2017-10-13 DIAGNOSIS — J45909 Unspecified asthma, uncomplicated: Secondary | ICD-10-CM | POA: Insufficient documentation

## 2017-10-13 DIAGNOSIS — Y929 Unspecified place or not applicable: Secondary | ICD-10-CM | POA: Insufficient documentation

## 2017-10-13 MED ORDER — OXYCODONE-ACETAMINOPHEN 5-325 MG PO TABS
1.0000 | ORAL_TABLET | ORAL | Status: AC | PRN
  Administered 2017-10-13 (×2): 1 via ORAL
  Filled 2017-10-13 (×2): qty 1

## 2017-10-13 MED ORDER — IBUPROFEN 800 MG PO TABS
800.0000 mg | ORAL_TABLET | Freq: Once | ORAL | Status: AC
  Administered 2017-10-13: 800 mg via ORAL
  Filled 2017-10-13: qty 1

## 2017-10-13 NOTE — ED Notes (Signed)
Pt did not want to wait for ortho to splint. She said she had to go.
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
## 2017-10-13 NOTE — ED Notes (Signed)
Pt went to x-ray.

## 2017-10-13 NOTE — Discharge Instructions (Signed)
You may use ibuprofen or tylenol for pain as you are currently in an opioid addiction treatment program with Dr. Enid Skeensaud in [place].
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
## 2017-12-07 ENCOUNTER — Ambulatory Visit (HOSPITAL_COMMUNITY)
Admission: RE | Admit: 2017-12-07 | Discharge: 2017-12-07 | Disposition: A | Discharge status: Home / Self Care | Payer: Self-pay | Attending: Psychiatry | Admitting: Psychiatry

## 2017-12-07 NOTE — BH Assessment (Signed)
Pt was called for assessment - reception stated pt had left.

## 2017-12-08 ENCOUNTER — Ambulatory Visit (HOSPITAL_COMMUNITY)
Admission: RE | Admit: 2017-12-08 | Discharge: 2017-12-08 | Disposition: A | Discharge status: Home / Self Care | Payer: Medicaid Other | Attending: Psychiatry | Admitting: Psychiatry

## 2017-12-08 NOTE — H&P (Signed)
Behavioral Health Medical Screening Exam  Brandi MerlMakayla Tucker is an 22 y.o. female patient presents as walk in at [facility] with complaints of seeking outpatient services to assist with medication management and Suboxone treatment.  Patient denies suicidal/self harm/homicidal ideation, psychosis, and paranoia.   Total Time spent with patient: 30 minutes  Psychiatric Specialty Exam: Physical Exam  Constitutional: She is oriented to person, place, and time. She appears well-developed and well-nourished.  HENT:  Head: Normocephalic.  Neck: Normal range of motion. Neck supple.  Respiratory: Effort normal.  Musculoskeletal: Normal range of motion.  Neurological: She is alert and oriented to person, place, and time.  Skin: Skin is warm.  Psychiatric: Her speech is normal and behavior is normal. Thought content normal. Her mood appears anxious. Cognition and memory are normal. She expresses impulsivity.    Review of Systems  Psychiatric/Behavioral: Positive for depression (Stable) and substance abuse. Hallucinations: Denies. Suicidal ideas: Denies. The patient is nervous/anxious (Stable).     Blood pressure 118/78, pulse 97, temperature 98.2 F (36.8 C), resp. rate 18, SpO2 100 %, unknown if currently breastfeeding.There is no height or weight on file to calculate BMI.  General Appearance: Casual  Eye Contact:  Good  Speech:  Clear and Coherent and Normal Rate  Volume:  Normal  Mood:  Anxious  Affect:  Appropriate and Congruent  Thought Process:  Coherent and Goal Directed  Orientation:  Full (Time, Place, and Person)  Thought Content:  Logical  Suicidal Thoughts:  No  Homicidal Thoughts:  No  Memory:  Immediate;   Good Recent;   Good Remote;   Good  Judgement:  Intact  Insight:  Present  Psychomotor Activity:  Normal  Concentration: Concentration: Good and Attention Span: Good  Recall:  Good  Fund of Knowledge:Good  Language: Good  Akathisia:  No  Handed:  Right  AIMS (if  indicated):     Assets:  Communication Skills Desire for Improvement Housing Physical Health  Sleep:       Musculoskeletal: Strength & Muscle Tone: within normal limits Gait & Station: normal Patient leans: N/A  Blood pressure 118/78, pulse 97, temperature 98.2 F (36.8 C), resp. rate 18, SpO2 100 %, unknown if currently breastfeeding.  Recommendations:Outpatient psychiatric and substance abuse services   Disposition: No evidence of imminent risk to self or others at present.   Patient does not meet criteria for psychiatric inpatient admission. .  Based on my evaluation the patient does not appear to have an emergency medical condition.  Aritza Brunet, NP 12/08/2017, 11:36 AM
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
## 2018-01-02 ENCOUNTER — Emergency Department (HOSPITAL_COMMUNITY)
Admission: EM | Admit: 2018-01-02 | Discharge: 2018-01-02 | Disposition: A | Discharge status: Home / Self Care | Payer: Medicaid Other | Attending: Emergency Medicine | Admitting: Emergency Medicine

## 2018-01-02 ENCOUNTER — Encounter (HOSPITAL_COMMUNITY): Payer: Self-pay | Admitting: Emergency Medicine

## 2018-01-02 DIAGNOSIS — F17210 Nicotine dependence, cigarettes, uncomplicated: Secondary | ICD-10-CM | POA: Insufficient documentation

## 2018-01-02 DIAGNOSIS — K089 Disorder of teeth and supporting structures, unspecified: Secondary | ICD-10-CM | POA: Insufficient documentation

## 2018-01-02 DIAGNOSIS — Z79899 Other long term (current) drug therapy: Secondary | ICD-10-CM | POA: Insufficient documentation

## 2018-01-02 DIAGNOSIS — J45909 Unspecified asthma, uncomplicated: Secondary | ICD-10-CM | POA: Insufficient documentation

## 2018-01-02 DIAGNOSIS — J020 Streptococcal pharyngitis: Secondary | ICD-10-CM

## 2018-01-02 DIAGNOSIS — K0889 Other specified disorders of teeth and supporting structures: Secondary | ICD-10-CM

## 2018-01-02 LAB — POC URINE PREG, ED: Preg Test, Ur: NEGATIVE

## 2018-01-02 LAB — GROUP A STREP BY PCR: Group A Strep by PCR: DETECTED — AB

## 2018-01-02 MED ORDER — OXYCODONE-ACETAMINOPHEN 5-325 MG PO TABS
1.0000 | ORAL_TABLET | Freq: Once | ORAL | Status: AC
  Administered 2018-01-02: 1 via ORAL
  Filled 2018-01-02: qty 1

## 2018-01-02 MED ORDER — CLINDAMYCIN HCL 150 MG PO CAPS: 450.0000 mg | ORAL_CAPSULE | Freq: Three times a day (TID) | ORAL | 0 refills | Status: AC

## 2018-01-02 NOTE — ED Triage Notes (Signed)
Pt presents to ED for assessment of generalized dental pain, hx of poor enamel and cavities.  States pain started 3 days ago.

## 2018-01-02 NOTE — Discharge Instructions (Signed)
You were given a prescription for antibiotics. Please take the antibiotic prescription fully.   I have prescribed a new medication for you today. It is important that when you pick the prescription up you discuss the potential interactions of this medication with other medications you are taking, including over the counter medications, with the pharmacists.   This new medication has potential side effects. Be sure to contact your primary care provider or return to the emergency department if you are experiencing new symptoms that you are unable to tolerate after starting the medication. You need to receive medical evaluation immediately if you start to experience blistering of the skin, rash, swelling, or difficulty breathing as these signs could indicate a more serious medication side effect.   Please follow up with your primary care provider within 5-7 days for re-evaluation of your symptoms. If you do not have a primary care provider, information for a healthcare clinic has been provided for you to make arrangements for follow up care. Please return to the emergency department for any new or worsening symptoms.

## 2018-01-02 NOTE — ED Provider Notes (Signed)
[facility] EMERGENCY DEPARTMENT Provider Note   CSN: [id number] Arrival date & time: 01/02/18  1618     History   Chief Complaint Chief Complaint  Patient presents with  . Dental Pain    HPI Brandi Tucker is a 22 y.o. female.  HPI  Pt is 22 y/o female with a h/o ADHD, anxiety, asthma, depression who presents to the ED today c/o dental pain that began 3 das ago. Pain located to bilat upper molar pain and lower central dental pain that feels like a throbbing pain. She rates is 10/10. She states she has tried tylenol at home with no relief. States pain is giving her a headache. She also reports a sore throat for the last 4 days and nasal congestion. She denies fevers or cough. Has been able to eat food at home.  Pt requesting a pregnancy test. No abd pain or vaginal bleeding.   Past Medical History:  Diagnosis Date  . Abortion   . ADHD (attention deficit hyperactivity disorder)   . Anxiety   . Asthma   . Depression   . Hx of gonorrhea   . Hx of suicide attempt    several yrs ago  . Suicidal ideation     Patient Active Problem List   Diagnosis Date Noted  . Rubella non-immune status, antepartum 04/06/2015  . 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 Marijuana abuse 03/27/2015    Past Surgical History:  Procedure Laterality Date  . INDUCED ABORTION       OB History    Gravida  3   Para  1   Term      Preterm      AB  1   Living  1     SAB      TAB      Ectopic      Multiple      Live Births  1            Home Medications    Prior to Admission medications   Medication Sig Start Date End Date Taking? Authorizing Provider  clindamycin (CLEOCIN) 150 MG capsule Take 3 capsules (450 mg total) by mouth 3 (three) times daily for 10 days. 01/02/18 01/12/18  Sharnese Heath S, PA-C  norelgestromin-ethinyl estradiol [facility]) 150-35 MCG/24HR transdermal patch Place 1 patch onto the skin once a  week. 05/01/15   Hildred Laser, MD  oxycodone (OXY-IR) 5 MG capsule Take 5 mg by mouth every 4 (four) hours as needed.    [provider]    Family History History reviewed. No pertinent family history.  Social History Social History   Tobacco Use  . Smoking status: Current Every Day Smoker    Packs/day: 0.50    Types: Cigarettes  . Smokeless tobacco: Never Used  Substance Use Topics  . Alcohol use: No    Alcohol/week: 0.0 oz  . Drug use: Yes    Frequency: 2.0 times per week    Types: Marijuana     Allergies   Patient has no known allergies.   Review of Systems Review of Systems  Constitutional: Negative for fever.  HENT: Positive for congestion, dental problem, rhinorrhea and sore throat.   Respiratory: Negative for cough and shortness of breath.   Cardiovascular: Negative for chest pain.  Gastrointestinal: Negative for abdominal pain.  Genitourinary: Negative for vaginal bleeding.  Musculoskeletal: Negative for neck pain.  Neurological: Positive for headaches. Negative for dizziness, weakness, light-headedness and  numbness.   Physical Exam Updated Vital Signs BP 121/74 (BP Location: Right Arm)   Pulse 77   Temp 97.8 F (36.6 C) (Axillary)   Resp 16   Ht 5\' 7"  (1.702 m)   Wt 63.5 kg (140 lb)   LMP 12/12/2017   SpO2 94%   BMI 21.93 kg/m   Physical Exam  Constitutional: She is oriented to person, place, and time. She appears well-developed and well-nourished. No distress.  HENT:  Head: Normocephalic and atraumatic.  Mouth/Throat: Oropharynx is clear and moist.  Bilateral TMs clear.  Pharyngeal erythema and tonsillar edema noted.  No tonsillar exudate.  Uvula midline.  No evidence of PTA or retropharyngeal abscess.  Diffuse dental caries.  Tooth #1 fractures and TTP, teeth #15 & 16 TTP.  No gross abscess noted.  No swelling beneath the tongue.  No swelling to the face or neck.  Eyes: Pupils are equal, round, and reactive to light. Conjunctivae and EOM  are normal.  Neck: Normal range of motion. Neck supple.  Cardiovascular: Normal rate, regular rhythm and normal heart sounds.  Pulmonary/Chest: Effort normal and breath sounds normal. No stridor. She has no wheezes.  Abdominal: Soft. There is no tenderness.  Musculoskeletal: Normal range of motion.  Lymphadenopathy:    She has cervical adenopathy.  Neurological: She is alert and oriented to person, place, and time. No cranial nerve deficit.  Skin: Skin is warm and dry. Capillary refill takes less than 2 seconds.  Psychiatric: She has a normal mood and affect.  Nursing note and vitals reviewed.  ED Treatments / Results  Labs (all labs ordered are listed, but only abnormal results are displayed) Labs Reviewed  GROUP A STREP BY PCR - Abnormal; Notable for the following components:      Result Value   Group A Strep by PCR DETECTED (*)    All other components within normal limits  POC URINE PREG, ED    EKG None  Radiology No results found.  Procedures Procedures (including critical care time)  Medications Ordered in ED Medications  oxyCODONE-acetaminophen (PERCOCET/ROXICET) 5-325 MG per tablet 1 tablet (1 tablet Oral Given 01/02/18 1807)     Initial Impression / Assessment and Plan / ED Course  I have reviewed the triage vital signs and the nursing notes.  Pertinent labs & imaging results that were available during my care of the patient were reviewed by me and considered in my medical decision making (see chart for details).     Final Clinical Impressions(s) / ED Diagnoses   Final diagnoses:  Pain, dental  Strep throat   Patient with toothache.  No gross abscess.  Exam unconcerning for Ludwig's angina or spread of infection.  Will treat with abx and pain medicine.  Urged patient to follow-up with dentist.  Resources given. Also c/o sore throat, strep swab which was positive.  Patient tolerating p.o. in the ED with no evidence of PTA or retropharyngeal abscess.  She will  be given a perception for Clindamycin to cover both dental infection and strep throat.  She was given instructions to follow-up with PCP and return if she has any new or worsening symptoms in the meantime.  Patient voices understanding of plan reasons return immediately to the ED.  All questions were answered.   ED Discharge Orders        Ordered    clindamycin (CLEOCIN) 150 MG capsule  3 times daily     01/02/18 1929       Gearline Spilman  S, PA-C 01/02/18 2022    Donnetta Hutchingook, Brian, MD 01/03/18 [phone]

## 2018-01-02 NOTE — ED Notes (Signed)
Followed up with lab on strep swab.  States 20 minutes left, error on first run

## 2018-01-02 NOTE — ED Notes (Signed)
Patient able to ambulate independently  

## 2018-01-22 ENCOUNTER — Encounter: Payer: Self-pay | Admitting: Pediatric Intensive Care

## 2018-01-25 ENCOUNTER — Encounter (HOSPITAL_COMMUNITY): Payer: Self-pay | Admitting: Emergency Medicine

## 2018-01-25 ENCOUNTER — Other Ambulatory Visit: Payer: Self-pay

## 2018-01-25 ENCOUNTER — Emergency Department (HOSPITAL_COMMUNITY)
Admission: EM | Admit: 2018-01-25 | Discharge: 2018-01-25 | Disposition: A | Discharge status: Home / Self Care | Payer: Medicaid Other | Attending: Emergency Medicine | Admitting: Emergency Medicine

## 2018-01-25 DIAGNOSIS — F332 Major depressive disorder, recurrent severe without psychotic features: Secondary | ICD-10-CM | POA: Insufficient documentation

## 2018-01-25 DIAGNOSIS — J45909 Unspecified asthma, uncomplicated: Secondary | ICD-10-CM | POA: Insufficient documentation

## 2018-01-25 DIAGNOSIS — R443 Hallucinations, unspecified: Secondary | ICD-10-CM

## 2018-01-25 DIAGNOSIS — F19132 Other psychoactive substance abuse with withdrawal with perceptual disturbance: Secondary | ICD-10-CM | POA: Diagnosis present

## 2018-01-25 DIAGNOSIS — F4325 Adjustment disorder with mixed disturbance of emotions and conduct: Secondary | ICD-10-CM | POA: Insufficient documentation

## 2018-01-25 DIAGNOSIS — F19232 Other psychoactive substance dependence with withdrawal with perceptual disturbance: Secondary | ICD-10-CM | POA: Insufficient documentation

## 2018-01-25 DIAGNOSIS — F17210 Nicotine dependence, cigarettes, uncomplicated: Secondary | ICD-10-CM | POA: Insufficient documentation

## 2018-01-25 DIAGNOSIS — F1510 Other stimulant abuse, uncomplicated: Secondary | ICD-10-CM | POA: Diagnosis present

## 2018-01-25 DIAGNOSIS — F22 Delusional disorders: Secondary | ICD-10-CM

## 2018-01-25 LAB — COMPREHENSIVE METABOLIC PANEL
ALK PHOS: 59 U/L (ref 38–126)
ALT: 18 U/L (ref 0–44)
AST: 29 U/L (ref 15–41)
Albumin: 4.4 g/dL (ref 3.5–5.0)
Anion gap: 9 (ref 5–15)
BILIRUBIN TOTAL: 0.9 mg/dL (ref 0.3–1.2)
BUN: 15 mg/dL (ref 6–20)
CALCIUM: 9.3 mg/dL (ref 8.9–10.3)
CO2: 25 mmol/L (ref 22–32)
CREATININE: 0.79 mg/dL (ref 0.44–1.00)
Chloride: 104 mmol/L (ref 98–111)
GFR calc Af Amer: >60 mL/min (ref >60)
GFR calc non Af Amer: >60 mL/min (ref >60)
Glucose, Bld: 109 mg/dL — ABNORMAL HIGH (ref 70–99)
Potassium: 3.4 mmol/L — ABNORMAL LOW (ref 3.5–5.1)
SODIUM: 138 mmol/L (ref 135–145)
TOTAL PROTEIN: 8.2 g/dL — AB (ref 6.5–8.1)

## 2018-01-25 LAB — RAPID URINE DRUG SCREEN, HOSP PERFORMED
Amphetamines: POSITIVE — AB
Barbiturates: NOT DETECTED
Benzodiazepines: NOT DETECTED
Cocaine: NOT DETECTED
OPIATES: POSITIVE — AB
Tetrahydrocannabinol: POSITIVE — AB

## 2018-01-25 LAB — CBC
HCT: 39.9 % (ref 36.0–46.0)
Hemoglobin: 13.1 g/dL (ref 12.0–15.0)
MCH: 27.8 pg (ref 26.0–34.0)
MCHC: 32.8 g/dL (ref 30.0–36.0)
MCV: 84.7 fL (ref 78.0–100.0)
PLATELETS: 335 10*3/uL (ref 150–400)
RBC: 4.71 MIL/uL (ref 3.87–5.11)
RDW: 15.7 % — AB (ref 11.5–15.5)
WBC: 7.3 10*3/uL (ref 4.0–10.5)

## 2018-01-25 LAB — I-STAT BETA HCG BLOOD, ED (MC, WL, AP ONLY): I-stat hCG, quantitative: <5 m[IU]/mL (ref <5)

## 2018-01-25 LAB — ETHANOL: Alcohol, Ethyl (B): <10 mg/dL (ref <10)

## 2018-01-25 MED ORDER — STERILE WATER FOR INJECTION IJ SOLN
INTRAMUSCULAR | Status: AC
  Administered 2018-01-25: 10 mL
  Filled 2018-01-25: qty 10

## 2018-01-25 MED ORDER — ZIPRASIDONE MESYLATE 20 MG IM SOLR
10.0000 mg | Freq: Once | INTRAMUSCULAR | Status: AC
  Administered 2018-01-25: 10 mg via INTRAMUSCULAR
  Filled 2018-01-25: qty 20

## 2018-01-25 MED ORDER — OLANZAPINE 5 MG PO TABS
5.0000 mg | ORAL_TABLET | Freq: Once | ORAL | Status: AC
  Administered 2018-01-25: 5 mg via ORAL
  Filled 2018-01-25: qty 1

## 2018-01-25 MED ORDER — OLANZAPINE 5 MG PO TBDP
2.5000 mg | ORAL_TABLET | Freq: Once | ORAL | Status: AC
  Administered 2018-01-25: 2.5 mg via ORAL
  Filled 2018-01-25: qty 0.5

## 2018-01-25 MED ORDER — LORAZEPAM 1 MG PO TABS
1.0000 mg | ORAL_TABLET | Freq: Once | ORAL | Status: AC
  Administered 2018-01-25: 1 mg via ORAL
  Filled 2018-01-25: qty 1

## 2018-01-25 NOTE — ED Triage Notes (Signed)
Pt reports using methamphetamines tonight and hallucinating. Pt very agiated and states that someone is trying to kill me.

## 2018-01-25 NOTE — ED Notes (Signed)
Patient standing at door, constantly playing with her hair.

## 2018-01-25 NOTE — ED Notes (Signed)
Patient requesting to speak with grandmother on the phone for pick up. This Clinical research associatewriter had attempted to call the listed number multiple times, but has only received a "full voicemail box" message every time.

## 2018-01-25 NOTE — ED Notes (Signed)
Patient is awake and pacing in the room. Patient continuously comes up to the nurses desk asking for medication "for my anxiety and paranoia." Patient reports "I feel like the anxiety I had last night is coming back." Patient requesting clonopin for her anxiety. Psych provider notified by phone. See new orders.
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
## 2018-01-25 NOTE — BH Assessment (Signed)
[facility] Assessment Progress Note Patient is requesting to be discharged. Peer support to be consulted to assist patient with SA OP referrals. Alease FrameNorman DO, Lord DNP also evaluated patient and recommended patient be discharged later this date.

## 2018-01-25 NOTE — ED Notes (Signed)
Bed: [facility] Expected date:  Expected time:  Means of arrival:  Comments: 
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
## 2018-01-25 NOTE — ED Notes (Signed)
Patient up for discharge. Patient provided with her clothing, purse, charger, and phone. Patient put on her jeans and shoes and took her phone, purse, and charger, but refused to take her shirt and underwear. Patient shirt and underwear sent to ED AD office.
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
## 2018-01-25 NOTE — BH Assessment (Signed)
[facility]  Per Juanetta BeetsJacqueline Norman, DO, this pt does not require psychiatric hospitalization at this time.  Pt presents under IVC initiated by EDP Paula LibraJohn Molpus, MD, which Dr Sharma CovertNorman has rescinded.  Pt does not require any discharge referrals at this time, but would benefit from seeing Peer Support Specialists; they will be asked to speak to pt.  Pt's nurse has been notified.  Doylene Canninghomas Alexios Keown, MA Triage Specialist [phone]

## 2018-01-25 NOTE — BH Assessment (Addendum)
Clinician attempted to assess patient. Patient delusional, paranoid and uncooperative. Patient refused assessment. TTS will reassess at a later time. Dr. Read DriversMolpus aware of situation. Per Dr. Read DriversMolpus patient is being IVC.

## 2018-01-25 NOTE — ED Notes (Signed)
Patient will only talk to pharmacy tech with the presence of security guard.  Both in room with patient now.

## 2018-01-25 NOTE — ED Notes (Signed)
Bed: WA29 Expected date:  Expected time:  Means of arrival:  Comments: 

## 2018-01-25 NOTE — BHH Suicide Risk Assessment (Signed)
Suicide Risk Assessment  Discharge Assessment   [facility]   Principal Problem: Methamphetamine abuse [facility](HCC) Discharge Diagnoses:  Patient Active Problem List   Diagnosis Date Noted  . Substance abuse withdrawal, with perceptual disturbance [facility](HCC) [F19.232] 01/25/2018    Priority: High  . Methamphetamine abuse (HCC) [F15.10] 01/25/2018    Priority: High  . Rubella non-immune status, antepartum [O99.89, Z28.3] 04/06/2015  . Adjustment disorder with mixed disturbance of emotions and conduct [F43.25] 03/27/2015  . Marijuana abuse [F12.10] 03/27/2015    Total Time spent with patient: 45 minutes  Musculoskeletal: Strength & Muscle Tone: within normal limits Gait & Station: normal Patient leans: N/A  Psychiatric Specialty Exam:   Blood pressure 128/89, pulse 76, temperature 97.6 F (36.4 C), temperature source Axillary, resp. rate 16, height 5\' 6"  (1.676 m), weight 63.5 kg (140 lb), SpO2 100 %, unknown if currently breastfeeding.Body mass index is 22.6 kg/m.  General Appearance: Disheveled  Eye Contact::  Good  Speech:  Normal Rate409  Volume:  Normal  Mood:  Anxious  Affect:  Congruent  Thought Process:  Coherent and Descriptions of Associations: Intact  Orientation:  Full (Time, Place, and Person)  Thought Content:  WDL and Logical  Suicidal Thoughts:  No  Homicidal Thoughts:  No  Memory:  Immediate;   Good Recent;   Good Remote;   Good  Judgement:  Fair  Insight:  Fair  Psychomotor Activity:  Normal  Concentration:  Good  Recall:  Good  Fund of Knowledge:Fair  Language: Good  Akathisia:  No  Handed:  Right  AIMS (if indicated):     Assets:  Leisure Time Physical Health Resilience Social Support  Sleep:     Cognition: WNL  ADL's:  Intact   Mental Status Per Nursing Assessment::   On Admission:   22 yo female who came to the ED after having delusions at the shelter.  Abusing opiates and meth.  Awakened this morning and promptly asked  for Klonopin.  No suicidal/homicidal ideations, hallucinations, and withdrawal symptoms.  Peer support consult placed, stable for discharge.  Demographic Factors:  Adolescent or young adult and Caucasian  Loss Factors: NA  Historical Factors: NA  Risk Reduction Factors:   Sense of responsibility to family, Positive social support and Positive therapeutic relationship  Continued Clinical Symptoms:  None  Cognitive Features That Contribute To Risk:  None    Suicide Risk:  Minimal: No identifiable suicidal ideation.  Patients presenting with no risk factors but with morbid ruminations; may be classified as minimal risk based on the severity of the depressive symptoms    Plan Of Care/Follow-up recommendations:  Activity:  as tolerated Diet:  heart healthy diet  LORD, JAMISON, NP 01/25/2018, 11:57 AM
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
## 2018-01-25 NOTE — ED Notes (Signed)
Patient called this nurse into the room and states "this medicine isnt working at all for me. Its making me more paranoid. Clonopin doesn't do that for me. Please ask the doctor to give me Clonopin." Writer notified Lord NP
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
## 2018-01-25 NOTE — ED Notes (Signed)
Patient very paranoid, she question who is everyone that passes by her room.  took a shower, escort back to room, gave a sandwich, waiting for MD, now asking for security guard.

## 2018-02-14 NOTE — Congregational Nurse Program (Signed)
New client encounter- she has been chronically homeless and last had medical care in [place]. She states a history of anxiety and ADHD. She states that [facility] but would like a PCP. CN explained [employer] process. Client states she will come to fill out application.

## 2019-04-12 IMAGING — CR DG WRIST COMPLETE 3+V*L*
4 series · 4 of 4 positions shown · non-contrast
Comparison: None.

CLINICAL DATA: Generalized left wrist pain after a fall. History of
prior wrist fracture. Initial encounter.

EXAM:
LEFT WRIST - COMPLETE 3+ VIEW

[wrist pa]
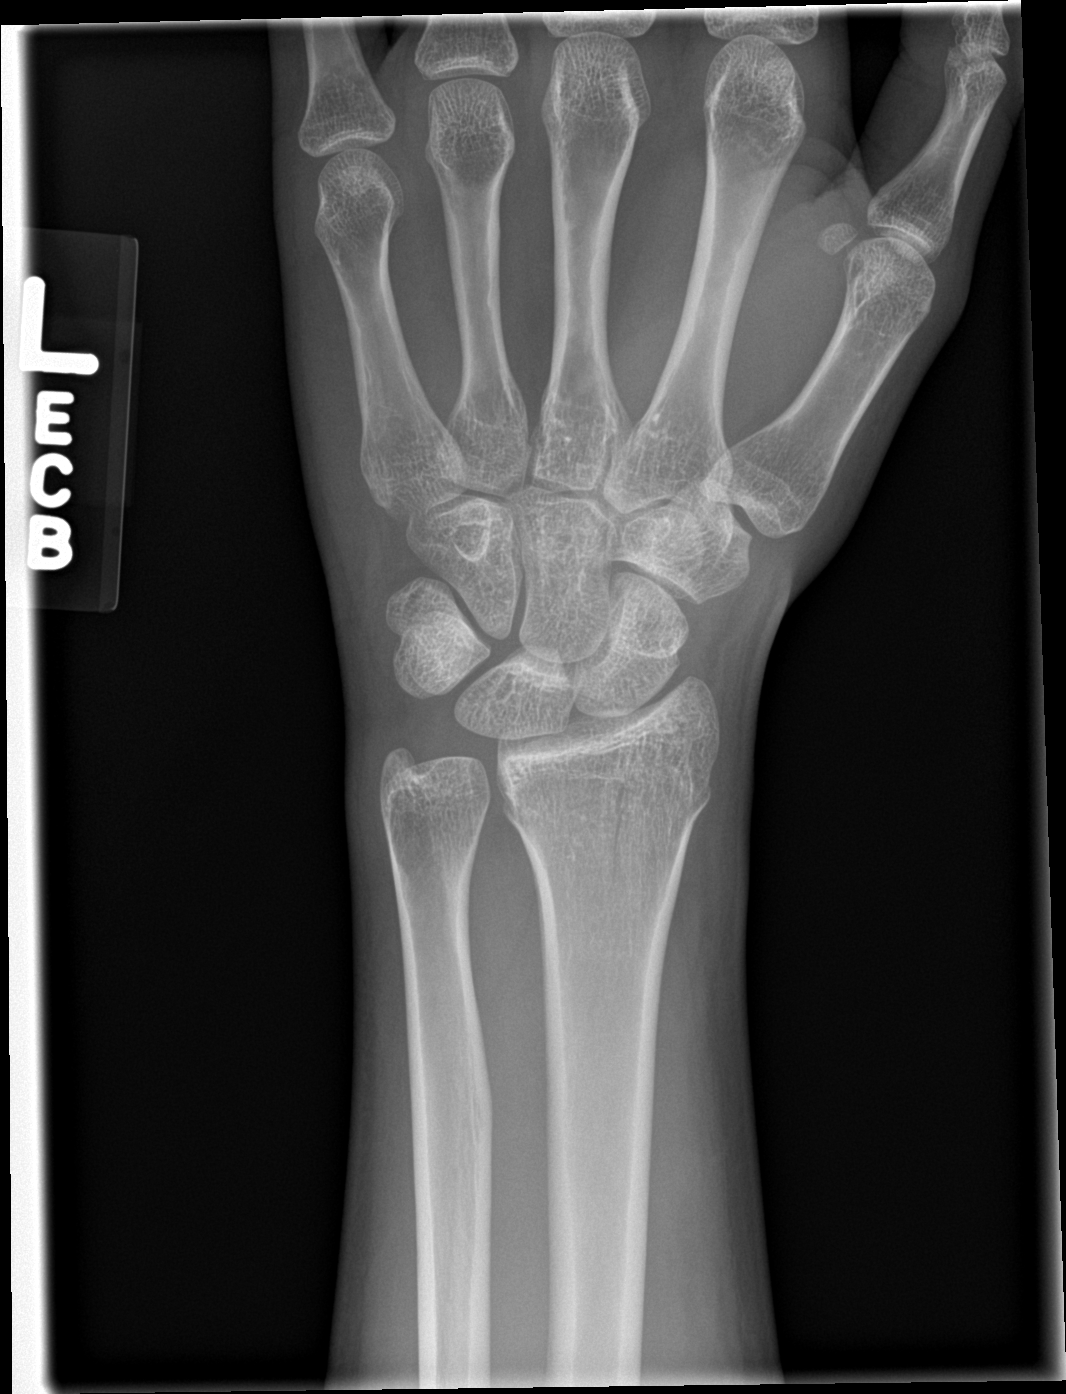

[wrist obl]
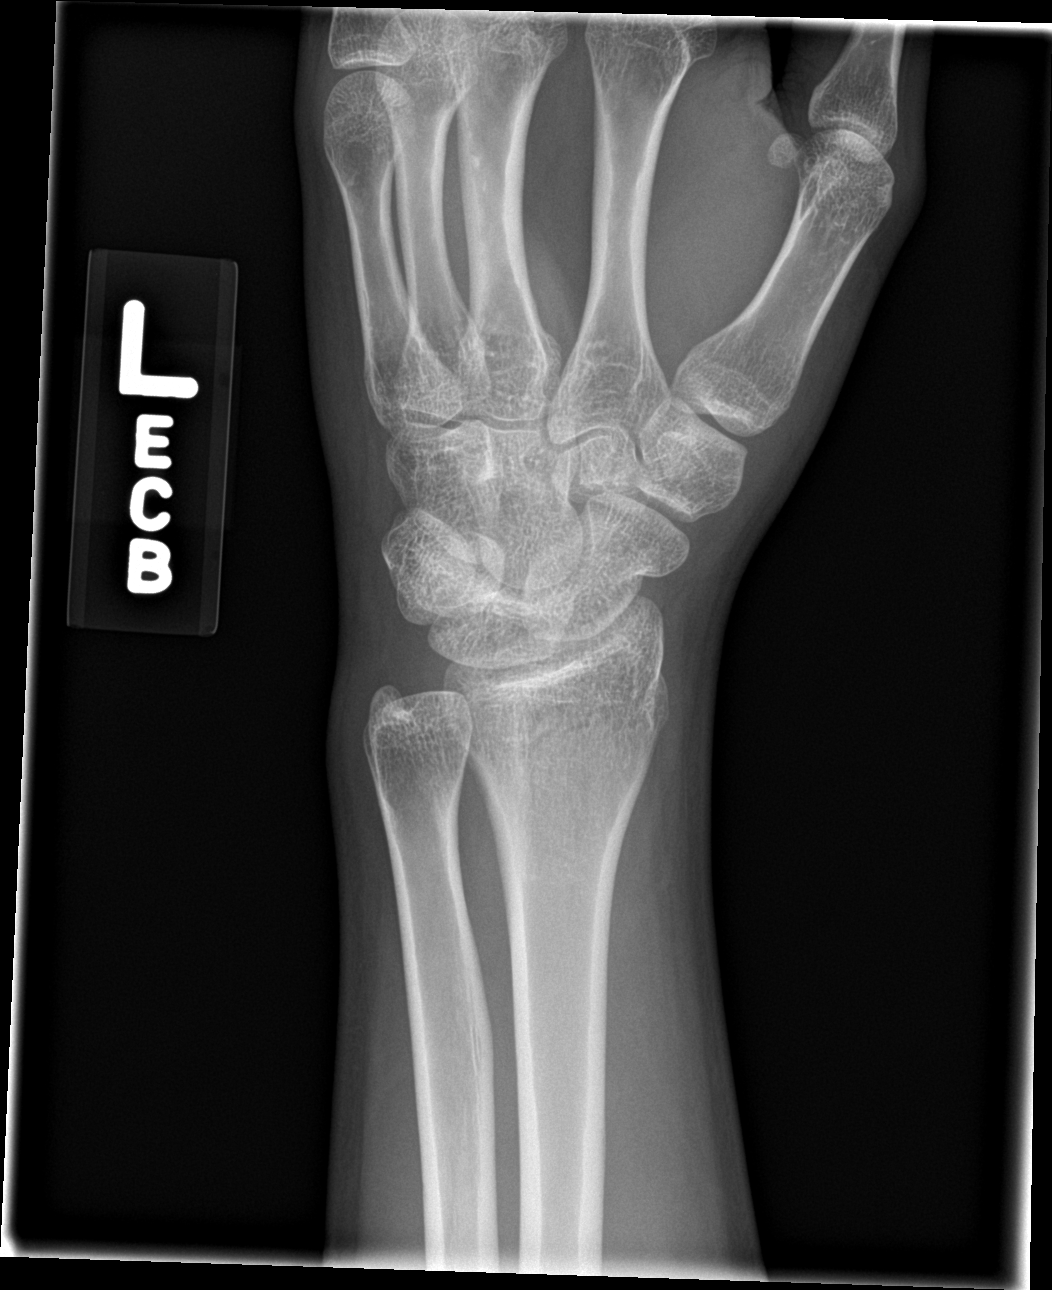

[wrist lat]
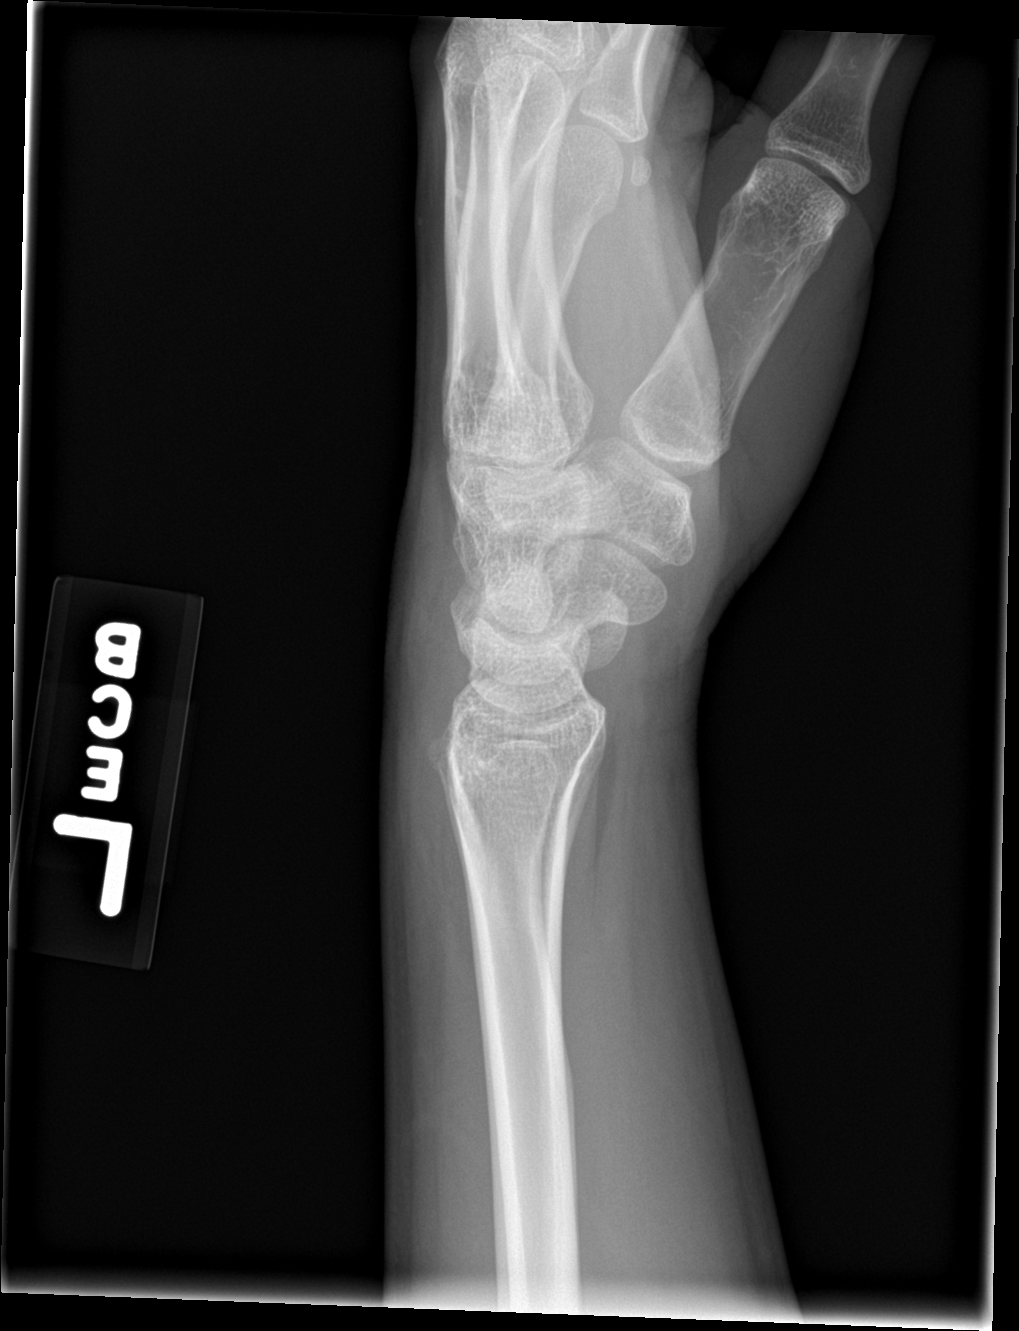

[wrist navicular]
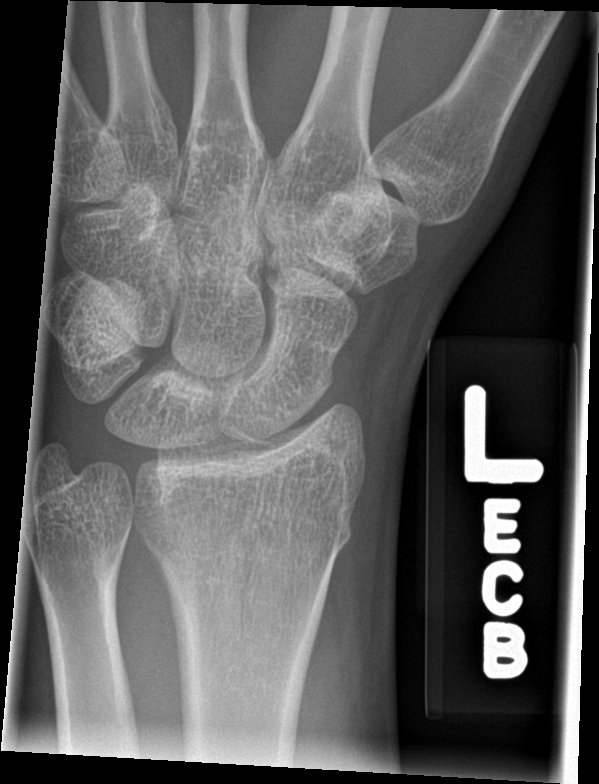

[4 of 4 positions shown; findings below may reference images not displayed]

FINDINGS: There is mild deformity of the distal radius with cortical buckling
and mild trabecular disruption consistent with a nondisplaced,
slightly impacted fracture though not definitively acute. The distal
ulna appears intact. There is no dislocation. Soft tissue swelling
is noted about the wrist.
IMPRESSION: Slight deformity of the distal radius consistent with an age
indeterminate fracture. Soft tissue swelling.
